# Patient Record
Sex: Male | Born: 1970 | Race: White | Hispanic: No | Marital: Married | Smoking: Current every day smoker
Health system: Southern US, Community
[De-identification: ages and names within clinical notes are randomized; demographics above are authoritative.]

## PROBLEM LIST (undated history)

## (undated) DIAGNOSIS — G589 Mononeuropathy, unspecified: Secondary | ICD-10-CM

## (undated) DIAGNOSIS — K602 Anal fissure, unspecified: Secondary | ICD-10-CM

## (undated) DIAGNOSIS — M1990 Unspecified osteoarthritis, unspecified site: Secondary | ICD-10-CM

## (undated) DIAGNOSIS — K589 Irritable bowel syndrome without diarrhea: Secondary | ICD-10-CM

## (undated) DIAGNOSIS — I10 Essential (primary) hypertension: Secondary | ICD-10-CM

## (undated) DIAGNOSIS — G4730 Sleep apnea, unspecified: Secondary | ICD-10-CM

## (undated) DIAGNOSIS — G709 Myoneural disorder, unspecified: Secondary | ICD-10-CM

## (undated) DIAGNOSIS — K219 Gastro-esophageal reflux disease without esophagitis: Secondary | ICD-10-CM

## (undated) DIAGNOSIS — E7800 Pure hypercholesterolemia, unspecified: Secondary | ICD-10-CM

## (undated) HISTORY — DX: Mononeuropathy, unspecified: G58.9

## (undated) HISTORY — PX: URETHRAL CYST REMOVAL: SHX5128

## (undated) HISTORY — DX: Sleep apnea, unspecified: G47.30

## (undated) HISTORY — DX: Gastro-esophageal reflux disease without esophagitis: K21.9

## (undated) HISTORY — DX: Anal fissure, unspecified: K60.2

## (undated) HISTORY — DX: Myoneural disorder, unspecified: G70.9

## (undated) HISTORY — DX: Irritable bowel syndrome, unspecified: K58.9

## (undated) HISTORY — PX: TONSILLECTOMY: SUR1361

## (undated) HISTORY — PX: KNEE SURGERY: SHX244

## (undated) HISTORY — PX: SHOULDER SURGERY: SHX246

## (undated) HISTORY — PX: COLONOSCOPY: SHX174

## (undated) HISTORY — PX: FRACTURE SURGERY: SHX138

## (undated) HISTORY — DX: Unspecified osteoarthritis, unspecified site: M19.90

## (undated) HISTORY — PX: ELBOW SURGERY: SHX618

---

## 1999-12-23 ENCOUNTER — Emergency Department (HOSPITAL_COMMUNITY): Admission: EM | Admit: 1999-12-23 | Discharge: 1999-12-23 | Payer: Self-pay

## 2002-03-14 ENCOUNTER — Encounter: Payer: Self-pay | Admitting: Psychologist

## 2002-03-14 ENCOUNTER — Observation Stay (HOSPITAL_COMMUNITY): Admission: EM | Admit: 2002-03-14 | Discharge: 2002-03-15 | Payer: Self-pay | Admitting: Emergency Medicine

## 2002-07-23 ENCOUNTER — Encounter: Payer: Self-pay | Admitting: Orthopedic Surgery

## 2002-07-23 ENCOUNTER — Inpatient Hospital Stay (HOSPITAL_COMMUNITY): Admission: EM | Admit: 2002-07-23 | Discharge: 2002-07-26 | Payer: Self-pay | Admitting: Emergency Medicine

## 2003-08-15 ENCOUNTER — Encounter: Admission: RE | Admit: 2003-08-15 | Discharge: 2003-08-15 | Payer: Self-pay | Admitting: Family Medicine

## 2004-09-18 ENCOUNTER — Emergency Department (HOSPITAL_COMMUNITY): Admission: EM | Admit: 2004-09-18 | Discharge: 2004-09-18 | Payer: Self-pay | Admitting: Emergency Medicine

## 2008-11-21 ENCOUNTER — Ambulatory Visit (HOSPITAL_COMMUNITY): Admission: RE | Admit: 2008-11-21 | Discharge: 2008-11-21 | Payer: Self-pay | Admitting: Surgery

## 2008-11-22 ENCOUNTER — Ambulatory Visit (HOSPITAL_BASED_OUTPATIENT_CLINIC_OR_DEPARTMENT_OTHER): Admission: RE | Admit: 2008-11-22 | Discharge: 2008-11-23 | Payer: Self-pay | Admitting: Urology

## 2010-03-06 ENCOUNTER — Emergency Department (HOSPITAL_COMMUNITY): Admission: EM | Admit: 2010-03-06 | Discharge: 2010-03-06 | Payer: Self-pay | Admitting: Emergency Medicine

## 2010-09-16 LAB — CBC
Platelets: 276 10*3/uL (ref 150–400)
RBC: 4.87 MIL/uL (ref 4.22–5.81)
WBC: 16.8 10*3/uL — ABNORMAL HIGH (ref 4.0–10.5)

## 2010-09-16 LAB — DIFFERENTIAL
Eosinophils Absolute: 0 10*3/uL (ref 0.0–0.7)
Lymphocytes Relative: 11 % — ABNORMAL LOW (ref 12–46)
Lymphs Abs: 1.8 10*3/uL (ref 0.7–4.0)
Monocytes Relative: 5 % (ref 3–12)
Neutro Abs: 14 10*3/uL — ABNORMAL HIGH (ref 1.7–7.7)
Neutrophils Relative %: 83 % — ABNORMAL HIGH (ref 43–77)

## 2010-09-16 LAB — WOUND CULTURE

## 2010-09-16 LAB — ANAEROBIC CULTURE

## 2010-09-16 LAB — POCT HEMOGLOBIN-HEMACUE: Hemoglobin: 15.7 g/dL (ref 13.0–17.0)

## 2010-10-22 NOTE — Op Note (Signed)
Aaron Poole, COUTANT               ACCOUNT NO.:  [id number]   MEDICAL RECORD NO.:  [id number]          PATIENT TYPE:  AMB   LOCATION:  [facility]                         FACILITY:  [facility]   PHYSICIAN:  Martina Sinner, MD DATE OF BIRTH:  16-Jun-1970   DATE OF PROCEDURE:  DATE OF DISCHARGE:                               OPERATIVE REPORT   PREOPERATIVE DIAGNOSIS:  Scrotal abscess.   POSTOPERATIVE DIAGNOSES:  1. Scrotal abscess.  2. Complicated urethral stricture.   SURGERY:  1. Drainage of scrotal abscess.  2. Cystoscopy.  3. Retrograde urethrogram.  4. Cystogram.  5. Insertion of Foley catheter.   [name] had minimal to no voiding dysfunction.  He came in  today with a scrotal abscess just to left lower lateral aspect of the  scrotum, almost more in the perineum.  He had a positive ultrasound  yesterday.   The patient was prepped and draped in the usual fashion.  He was given  preoperative antibiotics.  The patient was placed in lithotomy position.  Extra care was taken in positioning his heavy legs to minimize the risk  of injury.   I felt the 4 or 5 cm x 3-cm firm area in the area described.  I used a  marking pen.  I opened the abscess and there was foul-smelling pus.  It  was approximately half the size of my thumb.  I then finger-dissected  more deeply and laterally.  Even though I broke down a little bit of the  more soft tissue, there was no more abscess and just some mild  induration around the pocket.  I used saline and the Pulsavac for  approximately 900 mL.  I used cautery for some mild bleeding that was  superficial.   Before I packed the wound I thought it was best to cystoscope the  patient, recognizing that he could have an occult stricture with a  perforation, recognizing he really did not have any urinary symptoms  prior to this.   I initially used a flexible cystoscope and when I first looked inside, I  could see a penile urethra but a hazy, murky  bulbar urethra.  I was  concerned of the color of the urine and the fact that he might have  stricture.  I did not advance very far and then asked for a rigid  cystoscope.   I used a rigid cystoscope with the bag at appropriate height.  His  penile urethra was normal.  Again, he had a pale bulbar urethra and the  urine just looked cloudy.  I was concerned again that he had a  stricture.  I thought it was best to get a urethrogram at that point and  not try to scope or dilate a stricture that may be asymptomatic and not  contributing.  I basically wanted to make certain there was no  extravasation.   I would call for fluoroscopy.  I used a 20-cc syringe.  I did not  oblique him but with the penis on stretch, I did several fluoroscopic  and hard-copy views of his urethra.  He had a normal urethra with no  extravasation but he had an acute cut-off, in my opinion, 2 cm distal to  the membranous urethra.  He had minimal dye reach what appeared to be a  small bladder or empty bladder.  I brought fluoroscopy toward the  urethral meatus and, again, the urethra actually looked normal and  normal-caliber, but there was a nipple-like ending approximately 2 cm  distal to the membranous urethra but, again, no extravasation.   At this point, I thought it was best to use a 6-French open-end ureteral  catheter and place it into the proximal penile urethra and repeat the x-  rays, but the findings were basically the same.   I then used a 17-French cystoscope with the bottle up a little bit  higher.  It gave me a good view, and at this point his urethra was  bleeding minimally and he did have some mild panurethral stricture  disease.  I must say that his entire urethral caliber may be a little  bit smaller than most men, but the 21-French did eventually go in very  easily without dilation.  There was no question that he had what  appeared to be near-complete obliteration in the proximal bulbar   urethra.  Under fluoroscopy, I could see my scope stop where the x-ray  also stopped.   With the penis on stretch and with excellent help by the nurses, I tried  many times to pass a sensor wire through what I thought was a small  opening or dimple, but it was unsuccessful.   At this point in time I was concerned that he would need an open  suprapubic tube.  He is a very large man and his bladder did not appear  very full, and he could not safely have a suprapubic punch.   At this point in time, I asked Dr. Bjorn Pippin to come down.  Dr. Annabell Howells  actually used a rigid ureteroscope and duplicated the exact same  findings as I have just described.  We thought that we probably had to  open up the patient and put in the suprapubic tube, but Dr. Annabell Howells then  looked a little bit more dorsally and saw 1 little flap or opening.  A  sensor wire was easily placed through it up into the bladder.   Over the wire he placed a small dilator that is used for percutaneous  tubes.  It went in very nicely and was 14-French.  Fluoroscopically it  was in the bladder along with the curled sensor wire.  It was removed  and at this point in time we easily placed a 16-French council-tip  catheter over the wire.  I then did a cystogram before removing the  wire.  It was a low-volume cystogram but one could see the bladder with  no perforation and the Foley balloon sitting at the neck of the bladder.  The urine was clear and draining well.   The scrotal incision was packed with iodoform gauze, followed by a dry  dressing and scrotal support.   The patient was given perioperative antibiotics.   In my opinion, [name] appears to have a complex urethral stricture  disease.  I could see some spongy tissue distal to the obliteration, and  possibly this could be due to the retrograde with a closed-box  phenomenon, but really I was very careful and not using high volume  throughout the case.  I will leave the  catheter in for 10 days or  longer.  I will then do a peri-catheter urethrogram and not remove the  catheter until he has no extravasation.  Long-term management will  depend upon residual symptoms.  He may even need a cystoscopy under  anesthesia in the future.  A retrograde urethrogram following the peri-  catheter urethrogram will also be prudent   I spoke to the family, and the patient will be admitted overnight for  observation.          ______________________________  Martina Sinner, MD  Electronically Signed    SAM/MEDQ  D:  11/22/2008  T:  11/23/2008  Job:  [id number]

## 2010-10-25 NOTE — Op Note (Signed)
NAME:  Aaron Poole, Aaron Poole NO.:  [id number]   MEDICAL RECORD NO.:  [id number]                   PATIENT TYPE:  INP   LOCATION:  1828                                 FACILITY:  [facility]   PHYSICIAN:  Ollen Gross, M.D.                 DATE OF BIRTH:  01-Dec-1970   DATE OF PROCEDURE:  07/23/2002  DATE OF DISCHARGE:                                 OPERATIVE REPORT   PREOPERATIVE DIAGNOSIS:  Left tibial plateau fracture.   POSTOPERATIVE DIAGNOSES:  Left tibial plateau fracture.   PROCEDURE:  Open reduction internal fixation, left tibial plateau fracture  with bone grafting.   SURGEON:  Ollen Gross, M.D.   ASSISTANT:  Alexzandrew L. Julien Girt, P.A.   ANESTHESIA:  General.   ESTIMATED BLOOD LOSS:  Minimal.   DRAINS:  None.   COMPLICATIONS:  None.   TOURNIQUET TIME:  29 min at 350 mmHg.   CONDITION:  Stable to recovery.   BRIEF CLINICAL NOTE:  The patient is a 40 year old male who was playing  basketball earlier this evening and landed awkwardly, hyperextending his  left knee and having immediate pain and deformity.  He said he heard a pop  at the time of landing.  He subsequently was rushed to the [facility]  Emergency room and noted to have a lateral tibial plateau fracture; was  subsequently transferred here as he is a patient in our practice.  He had a  displaced, depressed lateral plateau fracture, but the condyle itself was  not displaced.  He presents now for open reduction, internal fixation and  bone grafting.   PROCEDURE IN DETAIL:  After the successful administration of general  anesthetic, a tourniquet was placed high on the left thigh and the left  lower extremity prepped and draped in the usual sterile fashion.  The  extremity was wrapped in Esmarch, knee flexed and tourniquet inflated to 300  mmHg.  Under fluoroscopic guidance I applied traction, and it really reduced  nicely.  The joint line was maintained, with the exception  of the depressed  area.  I then made about a two inch incision just lateral to the tibial  tubercle, and cut the skin with a #10 blade along the tibial crest.  We then  subperiosteally elevated the soft tissue over the proximal and lateral tibia  within this length of the incision.  The fracture identified and then with a  Cobb elevator I was able to elevate the central depressed fragment, back up  to where it was anatomic with the rest of the joint.  We then packed the  defect with 10 cc of Allomatrix graft with the cancellous chips.  This  effectively elevated the joint line back to its anatomic postion, and filled  in the defect in the metaphysis.  I then placed two guide pins for 6.5 Ace  cannulated screws, from lateral to medial across the  condyles parallel to  the joint line.  The lengths were measured and it was 80 and 85 mm.  Small  incisions were made, and the screws were placed with washers.  This  effectively led to essentially anatomic reduction of the fracture with less  than 1 mm of incongruency.  He was extremely pleased with the reduction, and  all the defect that was present on the preoperative xrays was essentially  obliterated with the bone graft.  The wounds were then copiously irrigated  with saline, and then the tourniquet released a total time of 29 min.  The  incisions with the cannulated screws were closed with  4-0 nylon.  The periosteum was closed with interrupted #1 Vicryl  subcutaneously, interrupted 2-0 Vicryl in skin and interrupted 4-0 nylon.  A  bulky sterile dressing was applied.  He was placed back into a knee  immobilizer, awakened and transferred to recovery room in stable condition.                                               Ollen Gross, M.D.    FA/MEDQ  D:  07/23/2002  T:  07/23/2002  Job:  [id number]

## 2010-10-25 NOTE — H&P (Signed)
NAME:  Aaron Poole, Aaron Poole NO.:  [id number]   MEDICAL RECORD NO.:  [id number]                   PATIENT TYPE:  INP   LOCATION:  5009                                 FACILITY:  [facility]   PHYSICIAN:  Ollen Gross, M.D.                 DATE OF BIRTH:  December 06, 1970   DATE OF ADMISSION:  07/23/2002  DATE OF DISCHARGE:                                HISTORY & PHYSICAL   CHIEF COMPLAINT:  Left leg pain.   HISTORY OF PRESENT ILLNESS:  This is a 40 year old male who was brought into  [facility] Emergency Department for an injury sustained to his left leg when  he was playing basketball earlier today. The patient states he was going up  for a shot and came down on an uneven surface of concrete, twisting and  landing onto his left lower leg. He had immediate onset of pain, unable to  ambulate. He was transferred eventually to [facility] for a plateau  fracture. Dr. Ollen Gross was on call for orthopedics at [facility]. The patient was seen and subsequently admitted to the hospital for  surgery.   ALLERGIES:  No known drug allergies.   INTOLERANCES:  Aspirin causes nose bleeds.   CURRENT MEDICATIONS:  Over-the-counter Zantac p.r.n.   PAST MEDICAL HISTORY:  Acid reflux, history of gastrointestinal ulcer. Past  history of fractures, bilateral clavicle fracture, right arm fracture, right  leg fracture, left arm fracture.   PAST SURGICAL HISTORY:  Right _____ arthroscopy x2, and also tonsillectomy.   SOCIAL HISTORY:  He is single, but currently engaged. A one to two pack per  day smoker. No alcohol. He works on an Engineer, manufacturing systems.   FAMILY HISTORY:  Significant for hypertension and cancer. His mother is  living age 48 with diabetes. His father living age 76 bipolar disease.   REVIEW OF SYMPTOMS:  General:  No fever, chills, or night sweats.  Neurologic:  He did have a presyncopal, light-headed type episode following  the fall due to  his pain. No seizure, syncope, or paralysis. Respiratory:  No shortness of breath. No hemoptysis. Cardiovascular: No chest pain, or  orthopnea. GI:  No nausea, vomiting, diarrhea, or constipation.  GU:  No  dysuria, hematuria, or discharge. Musculoskeletal:  Pertinent left leg as in  the history of present illness.   PHYSICAL EXAMINATION:  VITAL SIGNS:  Temperature 98.7, pulse 73,  respirations 16, blood pressure 167/92.  GENERAL:  The patient is a 40 year old white male well-nourished, well-  developed. No acute distress. He is alert, oriented, and cooperative.  Accompanied by his family.  HEENT:  Normocephalic, atraumatic. Pupils round and reactive. EOMs intact.  Oropharynx clear.  NECK:  Supple. No carotid bruit.  CHEST:  Clear to auscultation. Anterior breath sounds chest wall. No  rhonchi, no rales, no wheezing.  HEART:  Regular rate and rhythm. No murmur.  S1, S2 noted.  ABDOMEN:  Soft, nontender. Bowel sounds present. Round.  RECTAL:  Not done. Not pertinent to gross illness.  EXTREMITIES:  Significant of the left lower extremity. The left leg is noted  to be in knee immobilizer for stability. He has a large intra-articular  effusion on the left knee. Motor and flexion intact. Moved foot and toe on  exam. He has +2 dorsalis, pedis, and posterior tibial pulses noted on exam.  Sensation is intact.   LABORATORY DATA:  X-rays show a lateral tibial plateau intra-articular  component.   IMPRESSION:  1. Left lateral tibial plateau fracture.  2. Acid reflux.  3. History of gastrointestinal ulcer.   PLAN:  He is being admitted to [facility] to undergo ORIF of the  left tibial plateau fracture. Surgery is to be performed by Dr. Ollen Gross.     Alexzandrew L. Julien Girt, P.A.              Ollen Gross, M.D.    ALP/MEDQ  D:  07/26/2002  T:  07/26/2002  Job:  [id number]

## 2010-10-25 NOTE — Discharge Summary (Signed)
NAME:  Aaron Poole, Aaron Poole NO.:  [id number]   MEDICAL RECORD NO.:  [id number]                   PATIENT TYPE:  INP   LOCATION:  5009                                 FACILITY:  [facility]   PHYSICIAN:  Ollen Gross, M.D.                 DATE OF BIRTH:  12/21/1970   DATE OF ADMISSION:  07/23/2002  DATE OF DISCHARGE:  07/26/2002                                 DISCHARGE SUMMARY   ADMISSION DIAGNOSES:  1. Left lateral tibial plateau fracture.  2. Acid reflux.  3. History of gastrointestinal ulcer.   DISCHARGE DIAGNOSES:  1. Left lateral tibial plateau fracture, status post open reduction and     internal fixation left lateral tibial plateau.  2. Acid reflux.  3. History of gastrointestinal ulcer.   PROCEDURE:  The patient was taken to the operating room on July 23, 2002, and underwent open reduction and internal fixation of a left tibial  plateau fracture with bone grafting.  Surgeon was Ollen Gross, M.D.  Assistant was Alexzandrew L. Julien Girt, P.A. Anesthesia was general.  Minimal  blood loss.  Tourniquet time of 29 minutes at 350 mmHg.   CONSULTATIONS:  None.   HISTORY OF PRESENT ILLNESS:  The patient is a 40 year old male who was  brought to the [facility] emergency department after sustaining a  left leg injury when he was playing basketball earlier today. He landed  awkwardly, hyperextending his left knee, had immediate pain and deformity.  He was rushed to [facility] emergency room and noted to have a lateral  tibial plateau fracture and was subsequently transferred to [facility] as he was a patient in our practice. He was found to have the  displaced plateau fracture and it was felt he would best be served by  admission and surgery.   LABORATORY DATA:  CBC on admission; hemoglobin 14.9, hematocrit 42.7, white  blood cell count 14.9, red cell count 4.95.  Chemistry panel within normal  limits with the exception of  minimally elevated glucose of 128 and slightly  elevated ALT of 60.  PT/INR were followed throughout the hospital course and  defined in the laboratory data section of this chart.  Serial CBCs were  followed.  Hemoglobin dropped to 13.0 and 37.4.  Hematocrit was last noted  at 14.3 and 41.8.  Urinalysis did show positive ketones, otherwise negative  UA.  Urine culture; no growth.   HOSPITAL COURSE:  The patient was admitted to [facility] and taken  to the operating room and underwent the above stated procedure late that  evening.  Tolerated the procedure well.  Was later transferred to the  recovery room and then to the orthopedic floor for continued postoperative  care and the patient was placed on p.o. and IV analgesics for pain control  following surgery.  Started on Coumadin for DVT prophylaxis.  PT was ordered  postoperatively.  Strict nonweightbearing to the left lower extremity.  PT  and INR were followed daily for Coumadin protocol.  The patient was given 24  hours of postoperative IV antibiotics.  On day #1, the patient had a fair  amount of pain. Was starting to get up with physical therapy.  Encouraged in  ankle pumps.  Neurovascularly intact.  By day #2, the patient was doing a  little better and had better pain control.  Xanax was added for anxiety.  ___________ was initiated. Incision was healing well.  Sutures were intact.  He continued to progress with physical therapy and by postoperative day #3,  the patient was doing much better, was tolerated his medicines, had been up  ambulating and nonweightbearing with PT and it was decided he could be  discharged home. Home health was arranged through [place].   PLAN:  The patient was discharged home on July 26, 2002.   DISCHARGE MEDICATIONS:  1. Coumadin was performed as per protocol.  2. Percocet for pain.  3. Robaxin for spasm.  4. Xanax 0.25 mg p.r.n. anxiety.   DIET:  As tolerated.   FOLLOW UP:  The  patient will follow up in 10 to 14 days from surgery.  Call  the office for an appointment at [phone].   ACTIVITY:  Continue and encourage range of motion to the left knee.  He is  nonweightbearing to the left lower extremity.  Daily dressing changes at  home.  May start showering tomorrow.   DISPOSITION:  To home.   CONDITION ON DISCHARGE:  Improved.     Alexzandrew L. Julien Girt, P.A.              Ollen Gross, M.D.    ALP/MEDQ  D:  09/07/2002  T:  09/08/2002  Job:  [id number]

## 2010-10-25 NOTE — H&P (Signed)
NAME:  Aaron Poole, Aaron Poole NO.:  [id number]   MEDICAL RECORD NO.:  [id number]                   PATIENT TYPE:  EMS   LOCATION:  ED                                   [facility]ACILITY[facility]  [facility]   PHYSICIAN:  Angelia Mould. Derrell Lolling, M.D.             DATE OF BIRTH:  January 17, 1971   DATE OF ADMISSION:  03/14/2002  DATE OF DISCHARGE:                                HISTORY & PHYSICAL   CHIEF COMPLAINT:  Abdominal pain, vomiting, and diarrhea.   HISTORY OF PRESENT ILLNESS:  This is a 40 year old white man who states that  for two weeks he has had intermittent episodes of lower abdominal pain,  mostly suprapubic which occasionally radiates to the right side.  He states  that this pain is occurring daily, will last about one minute and then  completely resolve only to occur several hours later.  For the past three  days he has been vomiting once or twice a day and has had two to three  watery, nonbloody stools per day.  Denies fever or chills.  Denies prior  episodes.  Denies rigors.  He vomited in the middle of the night but this  morning he awoke and ate some chicken and has kept that down and does not  feel nauseated at this time.  He denies dysuria or urinary tract symptoms.  He is admitted for further evaluation and management.   PAST MEDICAL HISTORY:  He has had his knee arthroscoped twice.  He has had  tonsillectomy.  He has no other medical or surgical problems.   CURRENT MEDICATIONS:  None.   ALLERGIES:  ASPIRIN causes nose bleeds.   SOCIAL HISTORY:  The patient lives in [place].  He is separated.  He  lives with his child.  His parents live nearby.  He works for the [employer] for [employer].  He smokes one and a half packs of cigarettes  per day.  Denies the use of alcohol.  Denies the use of other drugs.   FAMILY HISTORY:  Mother living.  Has diabetes and hypertension.  Father  living.  Has had an appendectomy and has bipolar disorder.   REVIEW  OF SYMPTOMS:  All systems are reviewed and are noncontributory except  as described above.   PHYSICAL EXAMINATION:  GENERAL:  Pleasant, significantly overweight white  man in no acute distress.  He seems somewhat tired and fatigued, but does  not appear to be in much pain.  VITAL SIGNS:  Stated weight 256 pounds, temperature 98.8, pulse 71,  respirations 18, blood pressure 144/77.  HEENT:  Sclerae are clear.  Extraocular movements are intact.  Oropharynx  clear.  NECK:  Supple, nontender.  No mass.  No bruit.  LUNGS:  Clear to auscultation.  Question of mild right CVA tenderness.  HEART:  Regular rate and rhythm.  No murmur.  ABDOMEN:  Obese, soft, hypoactive bowel sounds,  not distended.  No mass.  No  hernia.  He is tender in the suprapubic area and the right lower quadrant  but his abdomen is soft.  The tenderness is only on deep palpation and I do  not detect any peritoneal signs.  GENITALIA:  Normal male pattern.  No hernias.  EXTREMITIES:  No edema.  Good pulses.  NEUROLOGIC:  Grossly within normal limits.   IMPRESSION:  Lower abdominal pain, vomiting, diarrhea, etiology unclear.  This might be an atypical appendicitis, a viral syndrome, or atypical  diverticulitis.  The patient is dehydrated.   PLAN:  The patient will be admitted.  Laboratory work and a CT scan of the  abdomen will be obtained.  IV fluids will be started now to rehydrate him.  I will reevaluate him later today after the CAT scan is done.                                               Angelia Mould. Derrell Lolling, M.D.    HMI/MEDQ  D:  03/14/2002  T:  03/14/2002  Job:  [id number]   cc:   [place]. Andrey Campanile, M.D.  [address]  [place]  [place] [address]  Fax: [phone]

## 2012-04-19 ENCOUNTER — Encounter (HOSPITAL_COMMUNITY): Payer: Self-pay | Admitting: *Deleted

## 2012-04-19 ENCOUNTER — Emergency Department (HOSPITAL_COMMUNITY): Payer: 59

## 2012-04-19 ENCOUNTER — Emergency Department (HOSPITAL_COMMUNITY)
Admission: EM | Admit: 2012-04-19 | Discharge: 2012-04-19 | Disposition: A | Discharge status: Home / Self Care | Payer: 59 | Attending: Emergency Medicine | Admitting: Emergency Medicine

## 2012-04-19 DIAGNOSIS — S52123A Displaced fracture of head of unspecified radius, initial encounter for closed fracture: Secondary | ICD-10-CM

## 2012-04-19 DIAGNOSIS — W1789XA Other fall from one level to another, initial encounter: Secondary | ICD-10-CM | POA: Insufficient documentation

## 2012-04-19 DIAGNOSIS — Y9389 Activity, other specified: Secondary | ICD-10-CM | POA: Insufficient documentation

## 2012-04-19 DIAGNOSIS — E7800 Pure hypercholesterolemia, unspecified: Secondary | ICD-10-CM | POA: Insufficient documentation

## 2012-04-19 DIAGNOSIS — Y929 Unspecified place or not applicable: Secondary | ICD-10-CM | POA: Insufficient documentation

## 2012-04-19 DIAGNOSIS — F17200 Nicotine dependence, unspecified, uncomplicated: Secondary | ICD-10-CM | POA: Insufficient documentation

## 2012-04-19 HISTORY — DX: Pure hypercholesterolemia, unspecified: E78.00

## 2012-04-19 MED ORDER — OXYCODONE-ACETAMINOPHEN 5-325 MG PO TABS: 2.0000 | ORAL_TABLET | ORAL | Status: DC | PRN

## 2012-04-19 MED ORDER — OXYCODONE-ACETAMINOPHEN 5-325 MG PO TABS
1.0000 | ORAL_TABLET | Freq: Once | ORAL | Status: AC
  Administered 2012-04-19: 1 via ORAL
  Filled 2012-04-19: qty 1

## 2012-04-19 NOTE — ED Notes (Signed)
Ortho tech contacted   To get a sling

## 2012-04-19 NOTE — ED Provider Notes (Signed)
History   This chart was scribed for Aaron Horn, MD by Gerlean Ren, ED Scribe. This patient was seen in room TR05C/TR05C and the patient's care was started at 9:16 PM    CSN: [id number]  Arrival date & time 04/19/12  4098   First MD Initiated Contact with Patient 04/19/12 2115      Chief Complaint  Patient presents with  . Arm Pain    right    (Consider location/radiation/quality/duration/timing/severity/associated sxs/prior treatment) The history is provided by the patient. No language interpreter was used.   DARCEL FRANE is a 41 y.o. male who presents to the Emergency Department complaining of severe right upper extremity tenderness and pain at right elbow after falling and bracing weight with right wrist while getting into truck earlier this evening with no head trauma or LOC.  Pt has no h/o chronic medical conditions.  Pt is a current everyday smoker and reports alcohol use.No weak/numbness. No head injury, no LOC, AMS, amnesia, neck or back pain.   Past Medical History  Diagnosis Date  . High cholesterol     History reviewed. No pertinent past surgical history.  History reviewed. No pertinent family history.  History  Substance Use Topics  . Smoking status: Current Every Day Smoker  . Smokeless tobacco: Not on file  . Alcohol Use: Yes      Review of Systems 10 Systems reviewed and are negative for acute change except as noted in the HPI.  Allergies  Aspirin  Home Medications   Current Outpatient Rx  Name  Route  Sig  Dispense  Refill  . OXYCODONE-ACETAMINOPHEN 5-325 MG PO TABS   Oral   Take 2 tablets by mouth every 4 (four) hours as needed for pain.   30 tablet   0     BP 155/103  Pulse 91  Temp 98.5 F (36.9 C) (Oral)  Resp 18  SpO2 95%  Physical Exam  Nursing note and vitals reviewed. Constitutional:       Awake, alert, nontoxic appearance.  HENT:  Head: Atraumatic.  Eyes: Right eye exhibits no discharge. Left eye exhibits no  discharge.  Neck: Neck supple.  Pulmonary/Chest: Effort normal. He exhibits no tenderness.  Abdominal: Soft. There is no tenderness. There is no rebound.  Musculoskeletal:       Cervical spine non-tender. Right hand and wrist have normal ROM and no bony or joint tenderness. CR<2secs right hand. Intact light touch and motor to median, radial, and ulnar Right shoulder non-tender.  Tenderness over right radial head. Superficial abrasions over olecranon process.   Neurological:       Mental status and motor strength appears baseline for patient and situation.  Skin: No rash noted.  Psychiatric: He has a normal mood and affect.    ED Course  Procedures (including critical care time) DIAGNOSTIC STUDIES: Oxygen Saturation is 95% on room air, adequate by my interpretation.    COORDINATION OF CARE: 9:20 PM- Patient informed of clinical course, understands medical decision-making process, and agrees with plan.  Informed pt of positive fracture of right radius.        Labs Reviewed - No data to display Dg Elbow Complete Right  04/19/2012  *RADIOLOGY REPORT*  Clinical Data: Arm pain.  Fall  RIGHT ELBOW - COMPLETE 3+ VIEW  Comparison: None  Findings: There is a moderate sized joint effusion.  Nondisplaced fracture involves the radial head.  No radiopaque foreign bodies or soft tissue calcifications.  No dislocation.  IMPRESSION:  1.  Joint effusion. 2.  Radial head fracture.   Original Report Authenticated By: Signa Kell, M.D.      1. Radial head fracture, closed       MDM  I personally performed the services described in this documentation, which was scribed in my presence. The recorded information has been reviewed and is accurate. I doubt any other EMC precluding discharge at this time including, but not necessarily limited to the following:TBI, CSI.        Aaron Horn, MD 04/23/12 [id number]

## 2012-04-19 NOTE — ED Notes (Signed)
Pt states that his left ankle has a hx of rolling and it did tonight when he was getting in to his truck. Pt states that he fell and landed on his right arm. Pt wife states elbow is swollen. Pt has arm in t-shirt in triage and cannot move arm. Pt states that hit right elbow.

## 2012-04-19 NOTE — Progress Notes (Signed)
Orthopedic Tech Progress Note Patient Details:  Aaron Poole 13-Feb-1971 [id number]  Ortho Devices Type of Ortho Device: Arm foam sling Ortho Device/Splint Location: (R) UE Ortho Device/Splint Interventions: Application   Jennye Moccasin 04/19/2012, 9:35 PM

## 2012-04-19 NOTE — ED Notes (Signed)
The pt has a painful entire rt arm especially in his rt elbow and his forearm.  He lost his footing and fell approx 4 hours ago.  His arm became more painful on the drive home.  Pain with movement,  Limited motion.

## 2013-06-02 IMAGING — CR DG ELBOW COMPLETE 3+V*R*
4 series · 4 of 4 positions shown · non-contrast
Comparison: None

CLINICAL DATA: Arm pain.  Fall

RIGHT ELBOW - COMPLETE 3+ VIEW

[x elbow joint lat right]
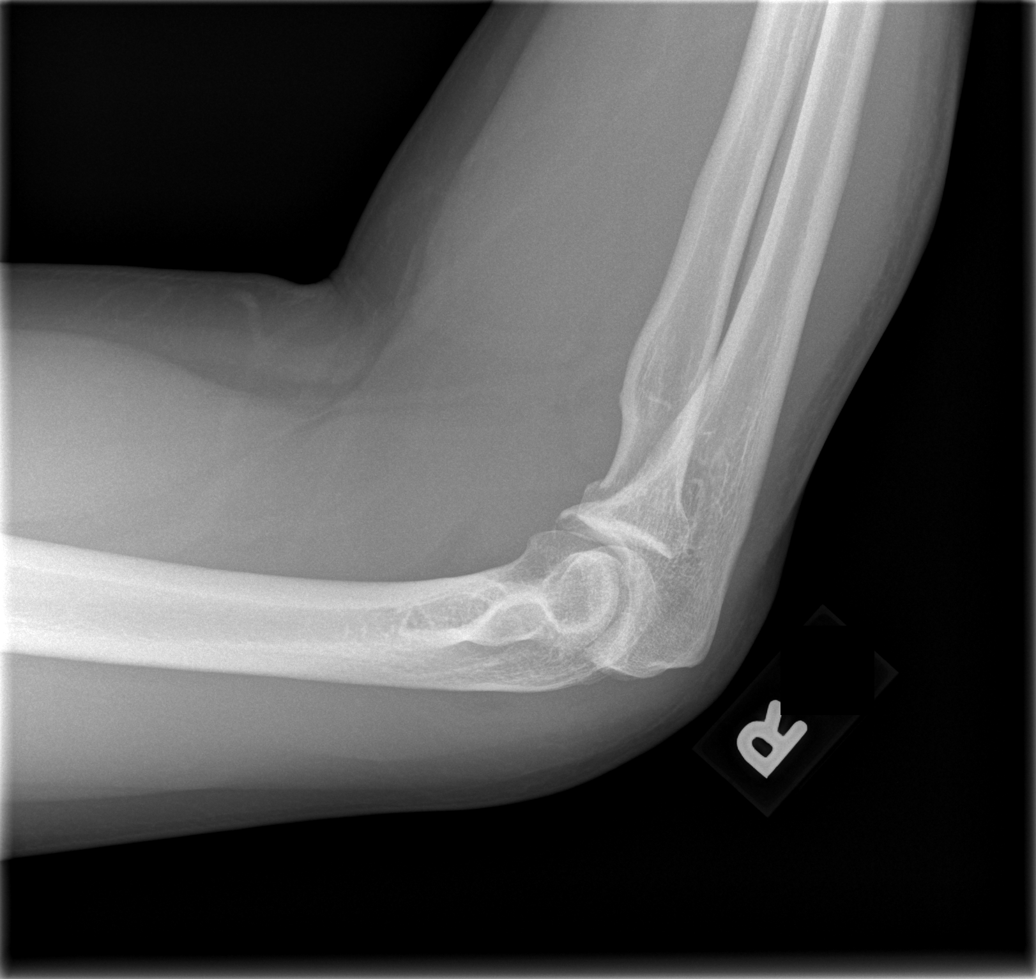

[x elbow joint ap right]
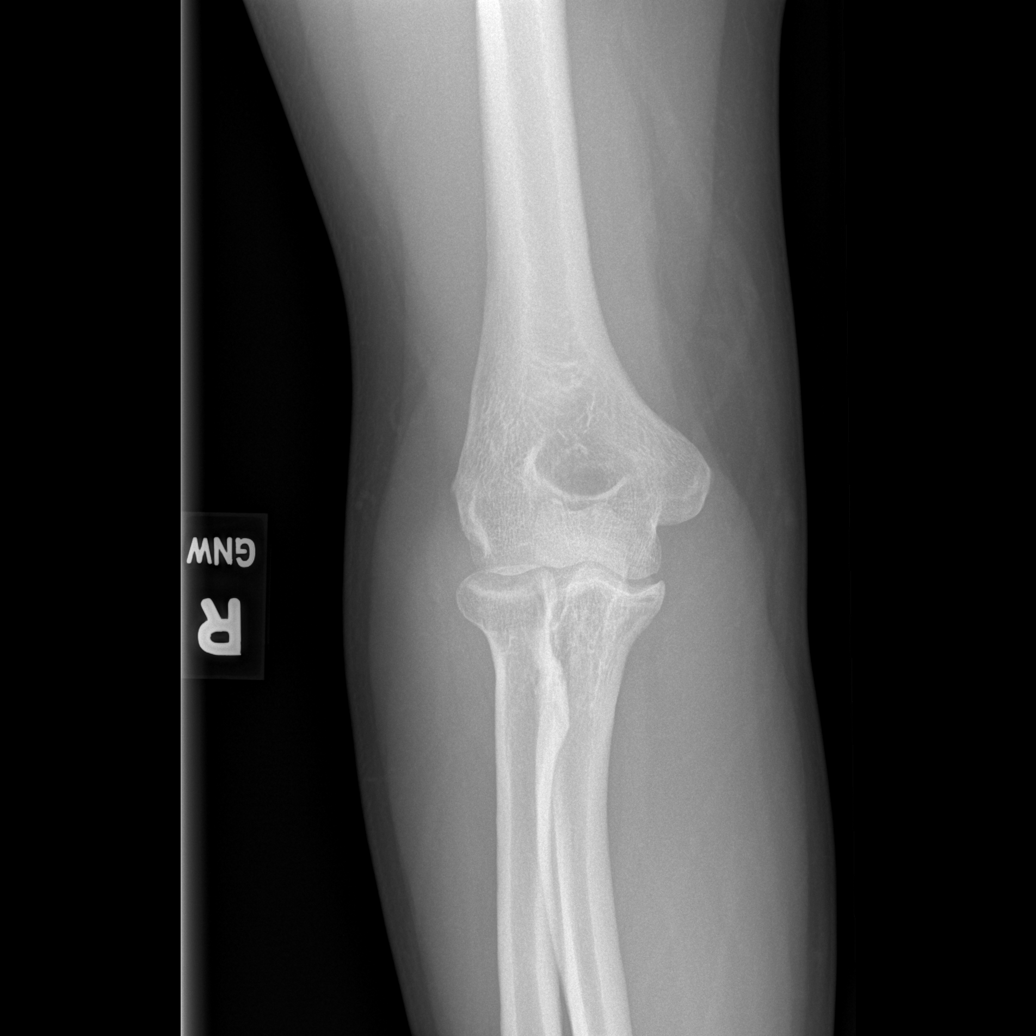

[x elbow joint obl. right]
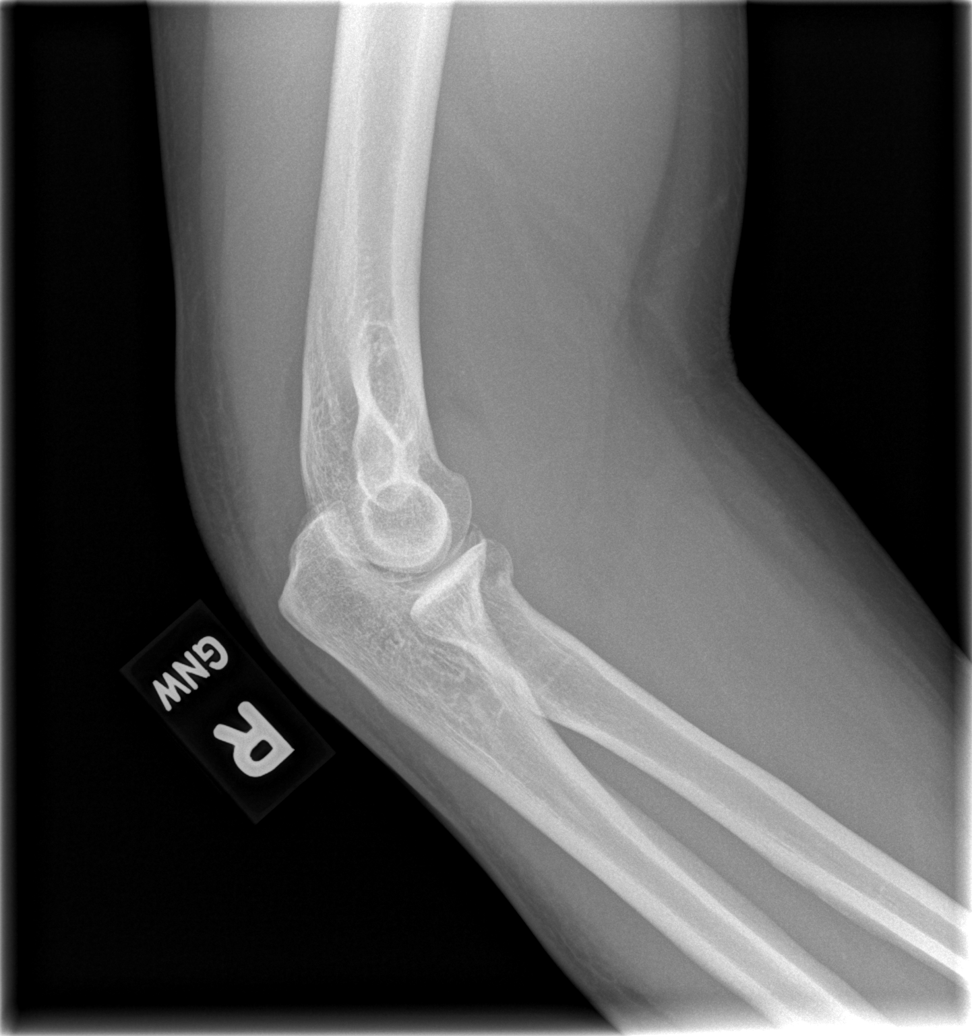

[x elbow joint obl. right *]
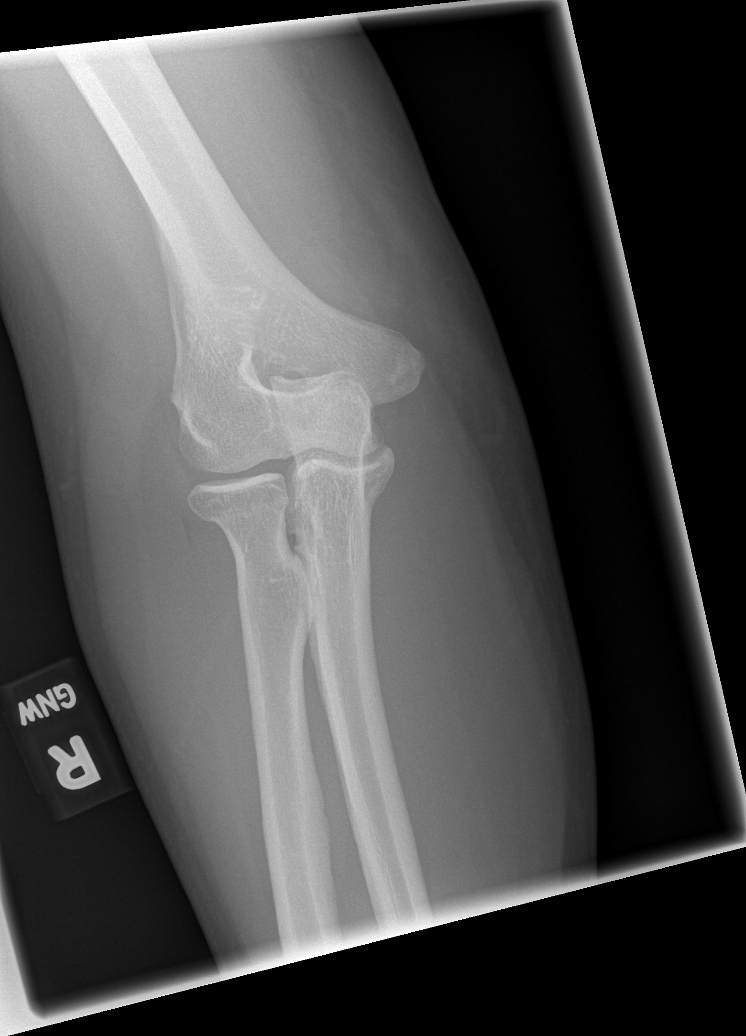

[4 of 4 positions shown; findings below may reference images not displayed]

FINDINGS: There is a moderate sized joint effusion.

Nondisplaced fracture involves the radial head.

No radiopaque foreign bodies or soft tissue calcifications.  No
dislocation.
IMPRESSION: 1.  Joint effusion.
2.  Radial head fracture.

## 2014-10-16 ENCOUNTER — Encounter (HOSPITAL_COMMUNITY): Payer: Self-pay | Admitting: Cardiology

## 2014-10-16 ENCOUNTER — Emergency Department (HOSPITAL_COMMUNITY): Payer: 59

## 2014-10-16 ENCOUNTER — Emergency Department (HOSPITAL_COMMUNITY)
Admission: EM | Admit: 2014-10-16 | Discharge: 2014-10-16 | Disposition: A | Discharge status: Home / Self Care | Payer: 59 | Attending: Emergency Medicine | Admitting: Emergency Medicine

## 2014-10-16 DIAGNOSIS — E780 Pure hypercholesterolemia: Secondary | ICD-10-CM | POA: Insufficient documentation

## 2014-10-16 DIAGNOSIS — K2970 Gastritis, unspecified, without bleeding: Secondary | ICD-10-CM | POA: Diagnosis not present

## 2014-10-16 DIAGNOSIS — R109 Unspecified abdominal pain: Secondary | ICD-10-CM | POA: Diagnosis present

## 2014-10-16 DIAGNOSIS — Z79899 Other long term (current) drug therapy: Secondary | ICD-10-CM | POA: Diagnosis not present

## 2014-10-16 DIAGNOSIS — Z720 Tobacco use: Secondary | ICD-10-CM | POA: Diagnosis not present

## 2014-10-16 DIAGNOSIS — K449 Diaphragmatic hernia without obstruction or gangrene: Secondary | ICD-10-CM | POA: Insufficient documentation

## 2014-10-16 LAB — URINALYSIS, ROUTINE W REFLEX MICROSCOPIC
BILIRUBIN URINE: NEGATIVE
Glucose, UA: 100 mg/dL — AB
Hgb urine dipstick: NEGATIVE
KETONES UR: NEGATIVE mg/dL
Leukocytes, UA: NEGATIVE
NITRITE: NEGATIVE
PH: 5 (ref 5.0–8.0)
PROTEIN: NEGATIVE mg/dL
Specific Gravity, Urine: 1.027 (ref 1.005–1.030)
Urobilinogen, UA: 1 mg/dL (ref 0.0–1.0)

## 2014-10-16 LAB — CBC WITH DIFFERENTIAL/PLATELET
Basophils Absolute: 0.1 10*3/uL (ref 0.0–0.1)
Basophils Relative: 1 % (ref 0–1)
EOS ABS: 0.5 10*3/uL (ref 0.0–0.7)
EOS PCT: 5 % (ref 0–5)
HCT: 47.3 % (ref 39.0–52.0)
Hemoglobin: 16 g/dL (ref 13.0–17.0)
LYMPHS ABS: 4.3 10*3/uL — AB (ref 0.7–4.0)
Lymphocytes Relative: 40 % (ref 12–46)
MCH: 29.8 pg (ref 26.0–34.0)
MCHC: 33.8 g/dL (ref 30.0–36.0)
MCV: 88.1 fL (ref 78.0–100.0)
MONO ABS: 1 10*3/uL (ref 0.1–1.0)
Monocytes Relative: 10 % (ref 3–12)
NEUTROS ABS: 4.7 10*3/uL (ref 1.7–7.7)
NEUTROS PCT: 44 % (ref 43–77)
PLATELETS: 265 10*3/uL (ref 150–400)
RBC: 5.37 MIL/uL (ref 4.22–5.81)
RDW: 13.2 % (ref 11.5–15.5)
WBC: 10.6 10*3/uL — AB (ref 4.0–10.5)

## 2014-10-16 LAB — COMPREHENSIVE METABOLIC PANEL
ALK PHOS: 60 U/L (ref 38–126)
ALT: 44 U/L (ref 17–63)
AST: 28 U/L (ref 15–41)
Albumin: 3.9 g/dL (ref 3.5–5.0)
Anion gap: 7 (ref 5–15)
BUN: 9 mg/dL (ref 6–20)
CO2: 27 mmol/L (ref 22–32)
Calcium: 9.2 mg/dL (ref 8.9–10.3)
Chloride: 105 mmol/L (ref 101–111)
Creatinine, Ser: 0.91 mg/dL (ref 0.61–1.24)
GLUCOSE: 87 mg/dL (ref 70–99)
POTASSIUM: 4.6 mmol/L (ref 3.5–5.1)
SODIUM: 139 mmol/L (ref 135–145)
TOTAL PROTEIN: 6.7 g/dL (ref 6.5–8.1)
Total Bilirubin: 0.8 mg/dL (ref 0.3–1.2)

## 2014-10-16 LAB — LIPASE, BLOOD: LIPASE: 27 U/L (ref 22–51)

## 2014-10-16 MED ORDER — IOHEXOL 300 MG/ML  SOLN
100.0000 mL | Freq: Once | INTRAMUSCULAR | Status: AC | PRN
  Administered 2014-10-16: 100 mL via INTRAVENOUS

## 2014-10-16 MED ORDER — SUCRALFATE 1 G PO TABS: 1.0000 g | ORAL_TABLET | Freq: Three times a day (TID) | ORAL | Status: DC

## 2014-10-16 MED ORDER — ESOMEPRAZOLE MAGNESIUM 40 MG PO CPDR: 40.0000 mg | DELAYED_RELEASE_CAPSULE | Freq: Every day | ORAL | Status: DC

## 2014-10-16 MED ORDER — IOHEXOL 300 MG/ML  SOLN
25.0000 mL | Freq: Once | INTRAMUSCULAR | Status: AC | PRN
  Administered 2014-10-16: 25 mL via ORAL

## 2014-10-16 NOTE — ED Provider Notes (Signed)
CSN: [id number]     Arrival date & time 10/16/14  1118 History   First MD Initiated Contact with Patient 10/16/14 1508     Chief Complaint  Patient presents with  . Abdominal Pain  . Emesis     (Consider location/radiation/quality/duration/timing/severity/associated sxs/prior Treatment) HPI Patient presents to the emergency department with abdominal pain that started several days ago.  The patient states that he has had similar abdominal pains in the past, but got worse over the last several days.  Patient states that he has pretty significant history for GERD symptoms.  Patient states that he does not have any chest pain, shortness of breath, nausea, diarrhea, weakness, dizziness, blurred vision, headache, back pain, neck pain, cough, fever, rash, or syncope.  The patient states that he has had several episodes of vomiting.  States that he noted that it had this occur darker appearance the last few times he has vomited. Past Medical History  Diagnosis Date  . High cholesterol    History reviewed. No pertinent past surgical history. History reviewed. No pertinent family history. History  Substance Use Topics  . Smoking status: Current Every Day Smoker  . Smokeless tobacco: Not on file  . Alcohol Use: Yes    Review of Systems  All other systems negative except as documented in the HPI. All pertinent positives and negatives as reviewed in the HPI.  Allergies  Aspirin  Home Medications   Prior to Admission medications   Medication Sig Start Date End Date Taking? Authorizing Provider  acetaminophen (TYLENOL) 500 MG tablet Take 500 mg by mouth every 6 (six) hours as needed for mild pain.   Yes Historical Provider, MD  rosuvastatin (CRESTOR) 5 MG tablet Take 5 mg by mouth daily.   Yes Historical Provider, MD   BP 135/72 mmHg  Pulse 55  Temp(Src) 98.1 F (36.7 C) (Oral)  Resp 13  Ht 6' (1.829 m)  Wt 305 lb (138.347 kg)  BMI 41.36 kg/m2  SpO2 98% Physical Exam   Constitutional: He is oriented to person, place, and time. He appears well-developed and well-nourished. No distress.  HENT:  Head: Normocephalic and atraumatic.  Mouth/Throat: Oropharynx is clear and moist.  Eyes: Pupils are equal, round, and reactive to light.  Neck: Normal range of motion. Neck supple.  Cardiovascular: Normal rate, regular rhythm and normal heart sounds.  Exam reveals no gallop and no friction rub.   No murmur heard. Pulmonary/Chest: Effort normal and breath sounds normal. No respiratory distress.  Abdominal: Soft. Bowel sounds are normal. He exhibits no distension. There is tenderness. There is no rebound and no guarding.  Neurological: He is alert and oriented to person, place, and time. He exhibits normal muscle tone. Coordination normal.  Skin: Skin is warm and dry. No rash noted. No erythema.  Nursing note and vitals reviewed.   ED Course  Procedures (including critical care time) Labs Review Labs Reviewed  CBC WITH DIFFERENTIAL/PLATELET - Abnormal; Notable for the following:    WBC 10.6 (*)    Lymphs Abs 4.3 (*)    All other components within normal limits  URINALYSIS, ROUTINE W REFLEX MICROSCOPIC - Abnormal; Notable for the following:    Color, Urine AMBER (*)    Glucose, UA 100 (*)    All other components within normal limits  COMPREHENSIVE METABOLIC PANEL  LIPASE, BLOOD    Imaging Review Ct Abdomen Pelvis W Contrast  10/16/2014   CLINICAL DATA:  Abdominal pain.  Coffee ground emesis.  EXAM: CT ABDOMEN  AND PELVIS WITH CONTRAST  TECHNIQUE: Multidetector CT imaging of the abdomen and pelvis was performed using the standard protocol following bolus administration of intravenous contrast.  CONTRAST:  162mL OMNIPAQUE IOHEXOL 300 MG/ML  SOLN  COMPARISON:  Report of CT scan dated 03/14/2002  FINDINGS: Large hiatal hernia. Heart size is normal. Lung bases are clear. The liver, biliary tree, spleen, pancreas, adrenal glands, and kidneys are normal. The bowel  appears normal including the terminal ileum and appendix. Bladder and prostate gland are normal. No adenopathy, free air, or free fluid. No calcifications in the abdominal aorta. Minimal calcification in the left common iliac artery. Focal arthritic changes of both femoral heads.  IMPRESSION: No acute abnormality of the abdomen or pelvis.  Large hiatal hernia.   Electronically Signed   By: Lorriane Shire M.D.   On: 10/16/2014 17:24   Patient is referred to GI for further evaluation of the hiatal hernia felt.  This is causing a large portion of his symptoms.  Patient is made aware of the findings.  Told to return here as needed.  Also advised him to follow-up with his primary care doctor   Dalia Heading, PA-C 10/19/14 [place], MD 10/25/14 1434

## 2014-10-16 NOTE — ED Notes (Signed)
Pt reports that he has had a burning sensation under his left ribs after he would eat. Reports he vomited this morning and it was coffee ground emesis. Also reports a sharp pain when he swallows. Sent from [facility]

## 2014-10-16 NOTE — Discharge Instructions (Signed)
Return here as needed.  Follow-up with the GI Dr. Provided.

## 2014-10-16 NOTE — ED Notes (Signed)
Pt in CT.

## 2014-10-17 ENCOUNTER — Encounter: Payer: Self-pay | Admitting: Gastroenterology

## 2014-12-22 ENCOUNTER — Telehealth: Payer: Self-pay | Admitting: Gastroenterology

## 2014-12-22 ENCOUNTER — Ambulatory Visit: Payer: 59 | Admitting: Gastroenterology

## 2014-12-22 NOTE — Telephone Encounter (Signed)
See below

## 2014-12-22 NOTE — Telephone Encounter (Signed)
Penalty should be applied.

## 2014-12-22 NOTE — Telephone Encounter (Signed)
Dr. Deatra Ina,  Patient called to reschedule appointment for today.  Was due to be seen at 10:00 am.  Per patient, there is just no way that he can make it today.  I have rescheduled patient.  Advise for same day cancellation.

## 2015-02-27 ENCOUNTER — Encounter: Payer: Self-pay | Admitting: Gastroenterology

## 2015-02-27 ENCOUNTER — Ambulatory Visit (INDEPENDENT_AMBULATORY_CARE_PROVIDER_SITE_OTHER): Payer: 59 | Admitting: Gastroenterology

## 2015-02-27 VITALS — BP 132/80 | HR 80 | Ht 73.0 in | Wt 314.0 lb

## 2015-02-27 DIAGNOSIS — K625 Hemorrhage of anus and rectum: Secondary | ICD-10-CM | POA: Insufficient documentation

## 2015-02-27 DIAGNOSIS — R1314 Dysphagia, pharyngoesophageal phase: Secondary | ICD-10-CM

## 2015-02-27 DIAGNOSIS — Z800 Family history of malignant neoplasm of digestive organs: Secondary | ICD-10-CM | POA: Insufficient documentation

## 2015-02-27 DIAGNOSIS — R1012 Left upper quadrant pain: Secondary | ICD-10-CM | POA: Diagnosis not present

## 2015-02-27 MED ORDER — NA SULFATE-K SULFATE-MG SULF 17.5-3.13-1.6 GM/177ML PO SOLN: 1.0000 | Freq: Once | ORAL | Status: DC

## 2015-02-27 NOTE — Assessment & Plan Note (Signed)
Left upper quadrant pain is postprandial and could be due to ulcer or nonulcer dyspepsia.  Symptoms do not sound like an incarcerated hiatal hernia.  Recommendations #1 continue Prilosec #2 EGD  CC Dr. Barrie Folk

## 2015-02-27 NOTE — Addendum Note (Signed)
Addended by: Oda Kilts on: 02/27/2015 03:08 PM   Modules accepted: Orders

## 2015-02-27 NOTE — Patient Instructions (Signed)

## 2015-02-27 NOTE — Assessment & Plan Note (Signed)
Limited rectal bleeding is most likely hemorrhoidal

## 2015-02-27 NOTE — Assessment & Plan Note (Signed)
Rule out esophageal stricture.    Recommendations #1 EGD with dilation as indicated 

## 2015-02-27 NOTE — Progress Notes (Signed)
_                                                                                                                History of Present Illness:  Aaron Poole is a pleasant 44 year old white male referred at the request of Dr. Kirby Poole for evaluation of chest and upper abdominal discomfort.  He very frequently develops immediate postprandial left upper quadrant and left chest discomfort.  Pain is described as moderate and aching.  CT scan for coffee-ground emesis in May, 2016 demonstrated a large hiatal hernia.  He complains of dysphagia to solids.  He has occasional pyrosis while taking Prilosec.  He also complains of intermittent rectal bleeding consisting of blood mixed with his stools in the water.  He's had rectal discomfort in the past.  Family history is pertinent for internal grandmother and her 3 siblings who had colon cancer.  Mother had colon polyps.   Past Medical History  Diagnosis Date  . High cholesterol   . IBS (irritable bowel syndrome)   . GERD (gastroesophageal reflux disease)   . Sleep apnea   . Arthritis   . Anal fissure    Past Surgical History  Procedure Laterality Date  . Knee surgery Bilateral   . Elbow surgery Right   . Shoulder surgery Right   . Urethral cyst removal     family history includes COPD in his mother; Colon cancer in his maternal aunt; Colon polyps in his mother; Diabetes in his maternal aunt, maternal grandfather, and mother; Irritable bowel syndrome in his mother and sister. Current Outpatient Prescriptions  Medication Sig Dispense Refill  . acetaminophen (TYLENOL) 500 MG tablet Take 500 mg by mouth every 6 (six) hours as needed for mild pain.    Marland Kitchen esomeprazole (NEXIUM) 40 MG capsule Take 1 capsule (40 mg total) by mouth daily. 30 capsule 0  . rosuvastatin (CRESTOR) 5 MG tablet Take 5 mg by mouth daily.    . sucralfate (CARAFATE) 1 G tablet Take 1 tablet (1 g total) by mouth 4 (four) times daily -  with meals and at bedtime. 30 tablet 0     No current facility-administered medications for this visit.   Allergies as of 02/27/2015 - Review Complete 02/27/2015  Allergen Reaction Noted  . Aspirin  04/19/2012    reports that he has been smoking Cigarettes.  He has a 26 pack-year smoking history. He does not have any smokeless tobacco history on file. He reports that he drinks alcohol. He reports that he does not use illicit drugs.   Review of Systems: Pertinent positive and negative review of systems were noted in the above HPI section. All other review of systems were otherwise negative.  Vital signs were reviewed in today's medical record Physical Exam: General: Obese male in no acute distress Skin: anicteric Head: Normocephalic and atraumatic Eyes:  sclerae anicteric, EOMI Ears: Normal auditory acuity Mouth: No deformity or lesions Neck: Supple, no masses or thyromegaly Lymph Nodes: no lymphadenopathy Lungs: Clear throughout to auscultation Heart:  Regular rate and rhythm; no murmurs, rubs or bruits Gastroinestinal: Soft, non tender and non distended. No masses, hepatosplenomegaly or hernias noted. Normal Bowel sounds Rectal:deferred Musculoskeletal: Symmetrical with no gross deformities  Skin: No lesions on visible extremities Pulses:  Normal pulses noted Extremities: No clubbing, cyanosis, edema or deformities noted Neurological: Alert oriented x 4, grossly nonfocal Cervical Nodes:  No significant cervical adenopathy Inguinal Nodes: No significant inguinal adenopathy Psychological:  Alert and cooperative. Normal mood and affect  See Assessment and Plan under Problem List

## 2015-02-27 NOTE — Assessment & Plan Note (Signed)
There is a strong family history of colon cancer on the patient's maternal side.  Mother strongly is at higher risk for colon cancer which may thus far have been prevented by polypectomies.  The patient probably is at higher risk as well.  Recommendations #1 colonoscopy

## 2015-04-13 ENCOUNTER — Encounter: Payer: 59 | Admitting: Gastroenterology

## 2015-05-10 ENCOUNTER — Encounter: Payer: Self-pay | Admitting: Gastroenterology

## 2015-05-10 ENCOUNTER — Ambulatory Visit (AMBULATORY_SURGERY_CENTER): Payer: 59 | Admitting: Gastroenterology

## 2015-05-10 VITALS — BP 126/60 | HR 58 | Temp 97.1°F | Resp 18 | Ht 73.0 in | Wt 314.0 lb

## 2015-05-10 DIAGNOSIS — D124 Benign neoplasm of descending colon: Secondary | ICD-10-CM | POA: Diagnosis not present

## 2015-05-10 DIAGNOSIS — R1314 Dysphagia, pharyngoesophageal phase: Secondary | ICD-10-CM

## 2015-05-10 DIAGNOSIS — K222 Esophageal obstruction: Secondary | ICD-10-CM

## 2015-05-10 DIAGNOSIS — K625 Hemorrhage of anus and rectum: Secondary | ICD-10-CM

## 2015-05-10 DIAGNOSIS — D125 Benign neoplasm of sigmoid colon: Secondary | ICD-10-CM | POA: Diagnosis not present

## 2015-05-10 DIAGNOSIS — K639 Disease of intestine, unspecified: Secondary | ICD-10-CM

## 2015-05-10 DIAGNOSIS — D129 Benign neoplasm of anus and anal canal: Secondary | ICD-10-CM

## 2015-05-10 DIAGNOSIS — D123 Benign neoplasm of transverse colon: Secondary | ICD-10-CM

## 2015-05-10 DIAGNOSIS — K635 Polyp of colon: Secondary | ICD-10-CM | POA: Diagnosis not present

## 2015-05-10 DIAGNOSIS — K621 Rectal polyp: Secondary | ICD-10-CM

## 2015-05-10 DIAGNOSIS — Z800 Family history of malignant neoplasm of digestive organs: Secondary | ICD-10-CM

## 2015-05-10 DIAGNOSIS — R1012 Left upper quadrant pain: Secondary | ICD-10-CM

## 2015-05-10 DIAGNOSIS — D128 Benign neoplasm of rectum: Secondary | ICD-10-CM

## 2015-05-10 MED ORDER — SODIUM CHLORIDE 0.9 % IV SOLN
500.0000 mL | INTRAVENOUS | Status: DC
Start: 2015-05-10 — End: 2015-05-11

## 2015-05-10 NOTE — Patient Instructions (Addendum)
YOU HAD AN ENDOSCOPIC PROCEDURE TODAY AT [facility]:   Refer to the procedure report that was given to you for any specific questions about what was found during the examination.  If the procedure report does not answer your questions, please call your gastroenterologist to clarify.  If you requested that your care partner not be given the details of your procedure findings, then the procedure report has been included in a sealed envelope for you to review at your convenience later.  YOU SHOULD EXPECT: Some feelings of bloating in the abdomen. Passage of more gas than usual.  Walking can help get rid of the air that was put into your GI tract during the procedure and reduce the bloating. If you had a lower endoscopy (such as a colonoscopy or flexible sigmoidoscopy) you may notice spotting of blood in your stool or on the toilet paper. If you underwent a bowel prep for your procedure, you may not have a normal bowel movement for a few days.  Please Note:  You might notice some irritation and congestion in your nose or some drainage.  This is from the oxygen used during your procedure.  There is no need for concern and it should clear up in a day or so.  SYMPTOMS TO REPORT IMMEDIATELY:   Following lower endoscopy (colonoscopy or flexible sigmoidoscopy):  Excessive amounts of blood in the stool  Significant tenderness or worsening of abdominal pains  Swelling of the abdomen that is new, acute  Fever of 100F or higher   Following upper endoscopy (EGD)  Vomiting of blood or coffee ground material  New chest pain or pain under the shoulder blades  Painful or persistently difficult swallowing  New shortness of breath  Fever of 100F or higher  Black, tarry-looking stools  For urgent or emergent issues, a gastroenterologist can be reached at any hour by calling [phone].   DIET: See dilation diet-  Likewise, meals heavy in dairy and vegetables can increase bloating.  Drink  plenty of fluids but you should avoid alcoholic beverages for 24 hours.  ACTIVITY:  You should plan to take it easy for the rest of today and you should NOT DRIVE or use heavy machinery until tomorrow (because of the sedation medicines used during the test).    FOLLOW UP: Our staff will call the number listed on your records the next business day following your procedure to check on you and address any questions or concerns that you may have regarding the information given to you following your procedure. If we do not reach you, we will leave a message.  However, if you are feeling well and you are not experiencing any problems, there is no need to return our call.  We will assume that you have returned to your regular daily activities without incident.  If any biopsies were taken you will be contacted by phone or by letter within the next 1-3 weeks.  Please call us at [phone] if you have not heard about the biopsies in 3 weeks.    SIGNATURES/CONFIDENTIALITY: You and/or your care partner have signed paperwork which will be entered into your electronic medical record.  These signatures attest to the fact that that the information above on your After Visit Summary has been reviewed and is understood.  Full responsibility of the confidentiality of this discharge information lies with you and/or your care-partner.  Esophagitis, stricture,anti-reflux regimen, dilation diet, polyps-handouts given  Continue prilosec  Repeat colonoscopy will be  determined by pathology.

## 2015-05-10 NOTE — Op Note (Signed)
[place]  [address] [place], [address]   ENDOSCOPY PROCEDURE REPORT  PATIENT: Aaron Poole, Aaron Poole  MR#: [id number] BIRTHDATE: January 22, 1971 , 44  yrs. old GENDER: male ENDOSCOPIST: Harl Bowie, MD REFERRED BY:  Lennie Odor, P.A. PROCEDURE DATE:  05/10/2015 PROCEDURE:  EGD w/ balloon dilation ASA CLASS:     Class II INDICATIONS:  dysphagia. MEDICATIONS: Propofol 300 mg IV TOPICAL ANESTHETIC: none  DESCRIPTION OF PROCEDURE: After the risks benefits and alternatives of the procedure were thoroughly explained, informed consent was obtained.  The LB [id number] endoscope was introduced through the mouth and advanced to the second portion of the duodenum , Without limitations.  The instrument was slowly withdrawn as the mucosa was fully examined.    Esophageal mucosa appeared normal, distal esophageal stricture/ring noted, was able to traverse without difficulty.  LA grade B erosive esophagitis.  Balloon dilation performed with 18,19 &20 mm, no heme noted.  Gastric mucosa was normal and duodenum appeared normal. Retroflexed views revealed as previously described.     The scope was then withdrawn from the patient and the procedure completed.  COMPLICATIONS: There were no immediate complications.  ENDOSCOPIC IMPRESSION: Esophageal mucosa appeared normal, distal esophageal stricture/ring noted, was able to traverse without difficulty.  LA grade B erosive esophagitis.  Balloon dilation performed with 18,19 &20 mm, no heme noted.  Gastric mucosa was normal and duodenum appeared normal  RECOMMENDATIONS: 1.  Continue PPI 2.  Anti-reflux regimen to be follow 3.  Proceed with a Colonoscopy.    eSigned:  Harl Bowie, MD 05/10/2015 2:49 PM

## 2015-05-10 NOTE — Progress Notes (Signed)
Report to PACU, RN, vss, BBS= Clear.  

## 2015-05-10 NOTE — Op Note (Signed)
[place]  [address]. [place], [address]   COLONOSCOPY PROCEDURE REPORT  PATIENT: Aaron Poole, Aaron Poole  MR#: [id number] BIRTHDATE: 24-Mar-1971 , 44  yrs. old GENDER: male ENDOSCOPIST: Harl Bowie, MD REFERRED [id number] Red[place], P.A. PROCEDURE DATE:  05/10/2015 PROCEDURE:   Colonoscopy, diagnostic, Colonoscopy with cold biopsy polypectomy, and Colonoscopy with snare polypectomy First Screening Colonoscopy - Avg.  risk and is 50 yrs.  old or older Yes.  Prior Negative Screening - Now for repeat screening. N/A  History of Adenoma - Now for follow-up colonoscopy & has been > or = to 3 yrs.  N/A  Polyps removed today? Yes ASA CLASS:   Class II INDICATIONS:Evaluation of unexplained GI bleeding, FH Colon or Rectal Adenocarcinoma, and FH Colon Adenoma. MEDICATIONS: Propofol 200 mg IV  DESCRIPTION OF PROCEDURE:   After the risks benefits and alternatives of the procedure were thoroughly explained, informed consent was obtained.  The digital rectal exam revealed no abnormalities of the rectum.   The LB [id number]  endoscope was introduced through the anus and advanced to the cecum, which was identified by both the appendix and ileocecal valve. No adverse events experienced.   The quality of the prep was fair.  The instrument was then slowly withdrawn as the colon was fully examined. Estimated blood loss is zero unless otherwise noted in this procedure report.      COLON FINDINGS: Multiple sessile polyps ranging between 3-67mm in size were found in the sigmoid colon & rectum (9), transverse colon (2), and descending colon(1).  Polypectomies was performed with a cold snare.  The resection was complete, the polyp tissue was completely retrieved and sent to histology.  Abnormal fold with polypoid mucosa in transverse colon was biopsied. poor  bowel prep with inadequate visualization of the mucosa in ascending colon. A few hyperplastic appearing polyps in rectum  were not removed.     The time to cecum = 3.2 Withdrawal time = 21.0   The scope was withdrawn and the procedure completed. COMPLICATIONS: There were no immediate complications.  ENDOSCOPIC IMPRESSION: multiple sessile polyps benign appearing removed  RECOMMENDATIONS: 1.  If the polyp(s) removed today are proven to be adenomatous (pre-cancerous) polyps, you will need a colonoscopy in 3 years. Otherwise you should continue to follow colorectal cancer screening guidelines for "high risk" patients with a colonoscopy in 5 years. You will receive a letter within 1-2 weeks with the results of your biopsy as well as final recommendations.  Please call my office if you have not received a letter after 3 weeks. 2.  Await pathology results  eSigned:  Harl Bowie, MD 05/10/2015 2:55 PM

## 2015-05-10 NOTE — Progress Notes (Signed)
Called to room to assist during endoscopic procedure.  Patient ID and intended procedure confirmed with present staff. Received instructions for my participation in the procedure from the performing physician.  

## 2015-05-11 ENCOUNTER — Telehealth: Payer: Self-pay | Admitting: *Deleted

## 2015-05-11 NOTE — Telephone Encounter (Signed)
No answer, message left for the patient. 

## 2015-05-22 ENCOUNTER — Encounter: Payer: Self-pay | Admitting: Gastroenterology

## 2015-10-08 ENCOUNTER — Ambulatory Visit: Payer: 59

## 2015-10-08 ENCOUNTER — Other Ambulatory Visit: Payer: Self-pay | Admitting: Physician Assistant

## 2015-10-08 DIAGNOSIS — R0602 Shortness of breath: Secondary | ICD-10-CM

## 2015-10-08 DIAGNOSIS — R062 Wheezing: Secondary | ICD-10-CM

## 2016-05-20 ENCOUNTER — Encounter (HOSPITAL_COMMUNITY): Payer: Self-pay | Admitting: Emergency Medicine

## 2016-05-20 ENCOUNTER — Emergency Department (HOSPITAL_COMMUNITY)
Admission: EM | Admit: 2016-05-20 | Discharge: 2016-05-20 | Disposition: A | Discharge status: Home / Self Care | Payer: 59 | Attending: Emergency Medicine | Admitting: Emergency Medicine

## 2016-05-20 DIAGNOSIS — S90561A Insect bite (nonvenomous), right ankle, initial encounter: Secondary | ICD-10-CM | POA: Diagnosis not present

## 2016-05-20 DIAGNOSIS — T450X5A Adverse effect of antiallergic and antiemetic drugs, initial encounter: Secondary | ICD-10-CM | POA: Insufficient documentation

## 2016-05-20 DIAGNOSIS — W57XXXA Bitten or stung by nonvenomous insect and other nonvenomous arthropods, initial encounter: Secondary | ICD-10-CM | POA: Diagnosis not present

## 2016-05-20 DIAGNOSIS — Y939 Activity, unspecified: Secondary | ICD-10-CM | POA: Diagnosis not present

## 2016-05-20 DIAGNOSIS — T50905A Adverse effect of unspecified drugs, medicaments and biological substances, initial encounter: Secondary | ICD-10-CM

## 2016-05-20 DIAGNOSIS — R21 Rash and other nonspecific skin eruption: Secondary | ICD-10-CM | POA: Diagnosis not present

## 2016-05-20 DIAGNOSIS — S90562A Insect bite (nonvenomous), left ankle, initial encounter: Secondary | ICD-10-CM | POA: Diagnosis present

## 2016-05-20 DIAGNOSIS — F17210 Nicotine dependence, cigarettes, uncomplicated: Secondary | ICD-10-CM | POA: Diagnosis not present

## 2016-05-20 DIAGNOSIS — S30861A Insect bite (nonvenomous) of abdominal wall, initial encounter: Secondary | ICD-10-CM | POA: Diagnosis not present

## 2016-05-20 DIAGNOSIS — Y999 Unspecified external cause status: Secondary | ICD-10-CM | POA: Insufficient documentation

## 2016-05-20 DIAGNOSIS — Y929 Unspecified place or not applicable: Secondary | ICD-10-CM | POA: Insufficient documentation

## 2016-05-20 MED ORDER — PERMETHRIN 5 % EX CREA: TOPICAL_CREAM | CUTANEOUS | 0 refills | Status: DC

## 2016-05-20 NOTE — ED Notes (Addendum)
Pt ambulated in hall independently; c/o slight dizziness on the way back to the room; pt described as felling "drugged"

## 2016-05-20 NOTE — ED Triage Notes (Signed)
Brought via ems from home.  Reports having rash to arms, ankles, and waist.  Took benadryl last night at 2130 and felt ok.  Reported being difficult to wake up and had dizziness this morning.  [id number]

## 2016-05-20 NOTE — ED Provider Notes (Signed)
[facility] DEPT Provider Note   CSN: [id number] Arrival date & time: 05/20/16  [id number]     History   Chief Complaint Chief Complaint  Patient presents with  . Dizziness    HPI Aaron Poole is a 45 y.o. male.  HPI   45 year old male presents today with complaints of rash and grogginess. Patient notes that several weeks ago he started developing a rash to his upper extremities and lower extremities. He notes symptoms are worse while sitting on his couch before going to bed. Patient notes he wakes up with new lesions in the morning, does not develop any throughout the day. Patient denies any history of the same, reports several weeks ago pulled him and his wife stayed in a hotel in [place]. He also notes numerous pets in the house. Patient reports lesions resolve quickly, but new ones come up every day. Patient reports extremely itchy. He notes last night he took 2 Benadryl at around 9:30 and went to bed. His wife notes that this morning upon awakening he was difficult to arouse, and groggy. Patient reports that normally only takes one Benadryl, last night he took 2. Patient reports he is not dizzy, just groggy from the Benadryl. He denies any acute neurological deficits, history of stroke, head trauma.  Patient reports he's feeling less groggy since initial awakening. He denies any infectious etiology. Patient does note that he has intermittent tingling in his finger that has been going on for several months. 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   Past Medical History:  Diagnosis Date  . Anal fissure   . Arthritis   . GERD (gastroesophageal reflux disease)   . High cholesterol   . IBS (irritable bowel syndrome)   . Sleep apnea     Patient Active Problem List   Diagnosis Date Noted  . Abdominal pain, left upper  quadrant 02/27/2015  . Dysphagia, pharyngoesophageal phase 02/27/2015  . Family history of colon cancer 02/27/2015  . Rectal bleeding 02/27/2015    Past Surgical History:  Procedure Laterality Date  . ELBOW SURGERY Right   . KNEE SURGERY Bilateral   . SHOULDER SURGERY Right   . URETHRAL CYST REMOVAL        Home Medications    Prior to Admission medications   Medication Sig Start Date End Date Taking? Authorizing Provider  acetaminophen (TYLENOL) 500 MG tablet Take 500 mg by mouth every 6 (six) hours as needed for mild pain.   Yes Historical Provider, MD  omeprazole (PRILOSEC) 40 MG capsule Take 40 mg by mouth daily.   Yes Historical Provider, MD  rosuvastatin (CRESTOR) 5 MG tablet Take 5 mg by mouth daily.   Yes Historical Provider, MD  permethrin (ELIMITE) 5 % cream Apply to affected area once 05/20/16   Okey Regal, PA-C    Family History Family History  Problem Relation Age of Onset  . Colon cancer Maternal Aunt     2 great aunts  . Colon polyps Mother   . Diabetes Mother   . COPD Mother   . Irritable bowel syndrome Mother   . Diabetes Maternal Grandfather   . Irritable bowel syndrome Sister   . Diabetes Maternal Aunt     Social History Social History  Substance Use Topics  . Smoking status: Current Every Day Smoker    Packs/day:  1.00    Years: 26.00    Types: Cigarettes  . Smokeless tobacco: Never Used  . Alcohol use 0.0 oz/week     Comment: occasionally     Allergies   Aspirin   Review of Systems Review of Systems  All other systems reviewed and are negative.    Physical Exam Updated Vital Signs BP 136/92   Pulse (!) 58   Temp 97.8 F (36.6 C) (Oral)   Resp 17   Ht 6' (1.829 m)   Wt (!) 138.3 kg   SpO2 98%   BMI 41.37 kg/m   Physical Exam  Constitutional: He is oriented to person, place, and time. He appears well-developed and well-nourished.  HENT:  Head: Normocephalic and atraumatic.  Eyes: Conjunctivae are normal. Pupils are  equal, round, and reactive to light. Right eye exhibits no discharge. Left eye exhibits no discharge. No scleral icterus.  Neck: Normal range of motion. No JVD present. No tracheal deviation present.  Pulmonary/Chest: Effort normal. No stridor.  Neurological: He is alert and oriented to person, place, and time. He has normal strength. No cranial nerve deficit or sensory deficit. Coordination normal. GCS eye subscore is 4. GCS verbal subscore is 5. GCS motor subscore is 6.  NIH 0  Skin: Skin is warm.  Numerous insect bites to ankles , abdomen, and upper extremities- various stages of healing. No signs of secondary infection   Psychiatric: He has a normal mood and affect. His behavior is normal. Judgment and thought content normal.  Nursing note and vitals reviewed.   ED Treatments / Results  Labs (all labs ordered are listed, but only abnormal results are displayed) Labs Reviewed - No data to display  EKG  EKG Interpretation None       Radiology No results found.  Procedures Procedures (including critical care time)  Medications Ordered in ED Medications - No data to display   Initial Impression / Assessment and Plan / ED Course  I have reviewed the triage vital signs and the nursing notes.  Pertinent labs & imaging results that were available during my care of the patient were reviewed by me and considered in my medical decision making (see chart for details).  Clinical Course      Final Clinical Impressions(s) / ED Diagnoses   Final diagnoses:  Insect bite, initial encounter  Adverse effect of drug, initial encounter    Labs:   Imaging:  Consults:  Therapeutics:  Discharge Meds:   Assessment/Plan:  45 year old presents today with likely bedbugs. Patient reports he was groggy, but notes taking double the dose of any stroke. Patient has no focal neurological deficits, no significant signs or symptoms of indicate intracranial abnormality. Patient reports that   his grogginess is due to the Benadryl, and "does not need to be here". Patient is requesting discharge home. Patient will be given a permethrin and instructed to use if symptoms continue to persist after careful inspection of house then extremity evaluation. Patient is instructed to avoid taking double the dose of Benadryl, return immediately if he experiences any new or worsening signs or symptoms. Patient was ambulated in the hall with no significant difficulties. Patient verbalized understanding and agreement to today's plan had no further questions or concerns at this time discharge   ew Prescriptions Discharge Medication List as of 05/20/2016 10:12 AM    START taking these medications   Details  permethrin (ELIMITE) 5 % cream Apply to affected area once, Print  Okey Regal, PA-C 05/20/16 1859    Gareth Morgan, MD 05/25/16 [phone]

## 2016-05-20 NOTE — Discharge Instructions (Signed)
Please avoid using Benadryl if it continues to cause significant grogginess. Please contact exterminator and have her house evaluated for bedbugs. If no bed bugs are noted, and you continued to develop rash, please use medication. Please follow-up your primary care for reassessment and evaluation. Please return the emergency room immediately if you experience any new or worsening signs or symptoms.

## 2016-06-30 DIAGNOSIS — M62830 Muscle spasm of back: Secondary | ICD-10-CM | POA: Diagnosis not present

## 2016-07-03 DIAGNOSIS — M549 Dorsalgia, unspecified: Secondary | ICD-10-CM | POA: Diagnosis not present

## 2016-10-07 DIAGNOSIS — M549 Dorsalgia, unspecified: Secondary | ICD-10-CM | POA: Diagnosis not present

## 2016-10-07 DIAGNOSIS — M47892 Other spondylosis, cervical region: Secondary | ICD-10-CM | POA: Diagnosis not present

## 2016-10-07 DIAGNOSIS — M545 Low back pain: Secondary | ICD-10-CM | POA: Diagnosis not present

## 2016-10-07 DIAGNOSIS — M5136 Other intervertebral disc degeneration, lumbar region: Secondary | ICD-10-CM | POA: Diagnosis not present

## 2016-10-07 DIAGNOSIS — M542 Cervicalgia: Secondary | ICD-10-CM | POA: Diagnosis not present

## 2016-11-21 DIAGNOSIS — M5136 Other intervertebral disc degeneration, lumbar region: Secondary | ICD-10-CM | POA: Diagnosis not present

## 2016-11-21 DIAGNOSIS — Z720 Tobacco use: Secondary | ICD-10-CM | POA: Diagnosis not present

## 2016-11-21 DIAGNOSIS — M545 Low back pain: Secondary | ICD-10-CM | POA: Diagnosis not present

## 2016-11-21 DIAGNOSIS — E7800 Pure hypercholesterolemia, unspecified: Secondary | ICD-10-CM | POA: Diagnosis not present

## 2017-01-02 DIAGNOSIS — M5136 Other intervertebral disc degeneration, lumbar region: Secondary | ICD-10-CM | POA: Diagnosis not present

## 2017-01-02 DIAGNOSIS — M545 Low back pain: Secondary | ICD-10-CM | POA: Diagnosis not present

## 2017-01-02 DIAGNOSIS — M722 Plantar fascial fibromatosis: Secondary | ICD-10-CM | POA: Diagnosis not present

## 2017-03-05 DIAGNOSIS — G4733 Obstructive sleep apnea (adult) (pediatric): Secondary | ICD-10-CM | POA: Diagnosis not present

## 2017-03-06 DIAGNOSIS — E7800 Pure hypercholesterolemia, unspecified: Secondary | ICD-10-CM | POA: Diagnosis not present

## 2017-07-08 DIAGNOSIS — G4733 Obstructive sleep apnea (adult) (pediatric): Secondary | ICD-10-CM | POA: Diagnosis not present

## 2017-11-18 DIAGNOSIS — G5602 Carpal tunnel syndrome, left upper limb: Secondary | ICD-10-CM | POA: Diagnosis not present

## 2017-11-18 DIAGNOSIS — G5601 Carpal tunnel syndrome, right upper limb: Secondary | ICD-10-CM | POA: Diagnosis not present

## 2017-11-18 DIAGNOSIS — M65341 Trigger finger, right ring finger: Secondary | ICD-10-CM | POA: Diagnosis not present

## 2017-12-12 DIAGNOSIS — G5602 Carpal tunnel syndrome, left upper limb: Secondary | ICD-10-CM | POA: Diagnosis not present

## 2017-12-12 DIAGNOSIS — G5601 Carpal tunnel syndrome, right upper limb: Secondary | ICD-10-CM | POA: Diagnosis not present

## 2018-03-03 DIAGNOSIS — G4733 Obstructive sleep apnea (adult) (pediatric): Secondary | ICD-10-CM | POA: Diagnosis not present

## 2018-07-27 ENCOUNTER — Encounter: Payer: Self-pay | Admitting: Gastroenterology

## 2022-03-28 ENCOUNTER — Encounter: Payer: Self-pay | Admitting: Gastroenterology

## 2022-04-28 ENCOUNTER — Ambulatory Visit (AMBULATORY_SURGERY_CENTER): Payer: Self-pay | Admitting: *Deleted

## 2022-04-28 VITALS — Ht 73.0 in | Wt 307.0 lb

## 2022-04-28 DIAGNOSIS — Z86010 Personal history of colonic polyps: Secondary | ICD-10-CM

## 2022-04-28 DIAGNOSIS — Z800 Family history of malignant neoplasm of digestive organs: Secondary | ICD-10-CM

## 2022-04-28 MED ORDER — NA SULFATE-K SULFATE-MG SULF 17.5-3.13-1.6 GM/177ML PO SOLN: 1.0000 | Freq: Once | ORAL | 0 refills | Status: AC

## 2022-04-28 NOTE — Progress Notes (Addendum)
No egg or soy allergy known to patient  No issues known to pt with past sedation with any surgeries or procedures Patient denies ever being told they had issues or difficulty with intubation  No FH of Malignant Hyperthermia Pt is not on diet pills Pt is not on  home 02  Pt is not on blood thinners  Pt has issues with constipation occassionally  No A fib or A flutter Have any cardiac testing pending--NO Pt instructed to use Singlecare.com or GoodRx for a price reduction on prep    Patient's chart reviewed by Osvaldo Angst CNRA prior to previsit and patient appropriate for the [facility].  Previsit completed and red dot placed by patient's name on their procedure day (on provider's schedule).

## 2022-05-28 ENCOUNTER — Encounter: Payer: Self-pay | Admitting: Gastroenterology

## 2022-05-30 ENCOUNTER — Ambulatory Visit (AMBULATORY_SURGERY_CENTER): Payer: 59 | Admitting: Gastroenterology

## 2022-05-30 ENCOUNTER — Encounter: Payer: Self-pay | Admitting: Gastroenterology

## 2022-05-30 VITALS — BP 141/80 | HR 54 | Temp 98.7°F | Resp 10 | Ht 73.0 in | Wt 307.0 lb

## 2022-05-30 DIAGNOSIS — D122 Benign neoplasm of ascending colon: Secondary | ICD-10-CM

## 2022-05-30 DIAGNOSIS — K621 Rectal polyp: Secondary | ICD-10-CM

## 2022-05-30 DIAGNOSIS — Z09 Encounter for follow-up examination after completed treatment for conditions other than malignant neoplasm: Secondary | ICD-10-CM | POA: Diagnosis present

## 2022-05-30 DIAGNOSIS — Z86010 Personal history of colonic polyps: Secondary | ICD-10-CM

## 2022-05-30 DIAGNOSIS — D128 Benign neoplasm of rectum: Secondary | ICD-10-CM

## 2022-05-30 DIAGNOSIS — Z800 Family history of malignant neoplasm of digestive organs: Secondary | ICD-10-CM | POA: Diagnosis not present

## 2022-05-30 DIAGNOSIS — K635 Polyp of colon: Secondary | ICD-10-CM

## 2022-05-30 MED ORDER — SODIUM CHLORIDE 0.9 % IV SOLN: 500.0000 mL | Freq: Once | INTRAVENOUS | Status: DC

## 2022-05-30 NOTE — Progress Notes (Signed)
Called to room to assist during endoscopic procedure.  Patient ID and intended procedure confirmed with present staff. Received instructions for my participation in the procedure from the performing physician.  

## 2022-05-30 NOTE — Op Note (Signed)
[facility] Patient Name: Aaron Poole Procedure Date: 05/30/2022 12:11 PM MRN: [id number] Endoscopist: Mauri Pole , MD, [id number] Age: 51 Referring MD:  Date of Birth: [birth year] Gender: Male Account #: [id number] Procedure:                Colonoscopy Indications:              Screening in patient at increased risk: Family                            history of 1st-degree relative with colorectal                            cancer, High risk colon cancer surveillance:                            Personal history of colonic polyps, High risk colon                            cancer surveillance: Personal history of sessile                            serrated colon polyp (less than 10 mm in size) with                            no dysplasia Medicines:                Monitored Anesthesia Care Procedure:                Pre-Anesthesia Assessment:                           - Prior to the procedure, a History and Physical                            was performed, and patient medications and                            allergies were reviewed. The patient's tolerance of                            previous anesthesia was also reviewed. The risks                            and benefits of the procedure and the sedation                            options and risks were discussed with the patient.                            All questions were answered, and informed consent                            was obtained. Prior Anticoagulants: The patient has  taken no anticoagulant or antiplatelet agents. ASA                            Grade Assessment: III - A patient with severe                            systemic disease. After reviewing the risks and                            benefits, the patient was deemed in satisfactory                            condition to undergo the procedure.                           After obtaining informed consent, the  colonoscope                            was passed under direct vision. Throughout the                            procedure, the patient's blood pressure, pulse, and                            oxygen saturations were monitored continuously. The                            Olympus PCF-H190DL ([id number]) Colonoscope was                            introduced through the anus and advanced to the the                            cecum, identified by appendiceal orifice and                            ileocecal valve. The colonoscopy was performed                            without difficulty. The patient tolerated the                            procedure well. The quality of the bowel                            preparation was adequate to identify polyps greater                            than 5 mm in size. The ileocecal valve, appendiceal                            orifice, and rectum were photographed. Scope In: 12:22:01 PM Scope Out: 12:41:47 PM Scope Withdrawal Time: 0 hours 16 minutes 15 seconds  Total Procedure Duration:  0 hours 19 minutes 46 seconds  Findings:                 The perianal and digital rectal examinations were                            normal.                           A 5 mm polyp was found in the transverse colon. The                            polyp was sessile. The polyp was removed with a                            cold snare. Resection and retrieval were complete.                           Three sessile polyps were found in the rectum. The                            polyps were 4 to 7 mm in size. These polyps were                            removed with a cold snare. Resection and retrieval                            were complete.                           Scattered small-mouthed diverticula were found in                            the sigmoid colon and descending colon.                            Peri-diverticular erythema was seen.                            Non-bleeding external and internal hemorrhoids were                            found during retroflexion. The hemorrhoids were                            medium-sized. Complications:            No immediate complications. Estimated Blood Loss:     Estimated blood loss was minimal. Impression:               - One 5 mm polyp in the transverse colon, removed                            with a cold snare. Resected and retrieved.                           -  Three 4 to 7 mm polyps in the rectum, removed                            with a cold snare. Resected and retrieved.                           - Mild diverticulosis in the sigmoid colon and in                            the descending colon. Peri-diverticular erythema                            was seen.                           - Non-bleeding external and internal hemorrhoids. Recommendation:           - Patient has a contact number available for                            emergencies. The signs and symptoms of potential                            delayed complications were discussed with the                            patient. Return to normal activities tomorrow.                            Written discharge instructions were provided to the                            patient.                           - Resume previous diet.                           - Continue present medications.                           - Await pathology results.                           - Repeat colonoscopy in 3 - 5 years for                            surveillance based on pathology results.                           - For future colonoscopy the patient will require                            an extended preparation. If there are any  questions, please contact the gastroenterologist. Mauri Pole, MD 05/30/2022 12:48:59 PM This report has been signed electronically.

## 2022-05-30 NOTE — Progress Notes (Signed)
Pt's states no medical or surgical changes since previsit or office visit. 

## 2022-05-30 NOTE — Progress Notes (Signed)
[facility] History and Physical   Primary Care Physician:  Lennie Odor, PA   Reason for Procedure:  History of adenomatous colon polyps, family h/o colon cancer  Plan:    Surveillance colonoscopy with possible interventions as needed     HPI: Aaron Poole is a very pleasant 51 y.o. male here for surveillance colonoscopy. Denies any nausea, vomiting, abdominal pain, melena or bright red blood per rectum  The risks and benefits as well as alternatives of endoscopic procedure(s) have been discussed and reviewed. All questions answered. The patient agrees to proceed.    Past Medical History:  Diagnosis Date   Anal fissure    Arthritis    GERD (gastroesophageal reflux disease)    High cholesterol    IBS (irritable bowel syndrome)    Neuromuscular disorder (HCC)    Pinched nerve    Sleep apnea     Past Surgical History:  Procedure Laterality Date   COLONOSCOPY     2016   ELBOW SURGERY Right    KNEE SURGERY Bilateral    SHOULDER SURGERY Right    TONSILLECTOMY     age 53   URETHRAL CYST REMOVAL      Prior to Admission medications   Medication Sig Start Date End Date Taking? Authorizing Provider  acetaminophen (TYLENOL) 500 MG tablet Take 500 mg by mouth every 6 (six) hours as needed for mild pain.   Yes [provider]  ibuprofen (ADVIL) 200 MG tablet Take 200 mg by mouth as needed.    [provider]    Current Outpatient Medications  Medication Sig Dispense Refill   acetaminophen (TYLENOL) 500 MG tablet Take 500 mg by mouth every 6 (six) hours as needed for mild pain.     ibuprofen (ADVIL) 200 MG tablet Take 200 mg by mouth as needed.     Current Facility-Administered Medications  Medication Dose Route Frequency Provider Last Rate Last Admin   0.9 %  sodium chloride infusion  500 mL Intravenous Once Mauri Pole, MD        Allergies as of 05/30/2022 - Review Complete 05/30/2022  Allergen Reaction Noted   Aspirin   04/19/2012    Family History  Problem Relation Age of Onset   Colon polyps Mother    Diabetes Mother    COPD Mother    Irritable bowel syndrome Mother    Irritable bowel syndrome Sister    Diabetes Maternal Aunt    Diabetes Maternal Grandfather    Colon cancer Other    Crohn's disease Neg Hx    Esophageal cancer Neg Hx    Rectal cancer Neg Hx    Stomach cancer Neg Hx    Ulcerative colitis Neg Hx     Social History   Socioeconomic History   Marital status: Married    Spouse name: Not on file   Number of children: 5   Years of education: Not on file   Highest education level: Not on file  Occupational History   Occupation: truck driver  Tobacco Use   Smoking status: Every Day    Packs/day: 1.00    Years: 26.00    Total pack years: 26.00    Types: Cigarettes    Passive exposure: Current   Smokeless tobacco: Never  Vaping Use   Vaping Use: Never used  Substance and Sexual Activity   Alcohol use: Yes    Alcohol/week: 0.0 standard drinks of alcohol    Comment: occasionally   Drug use: No  Sexual activity: Not on file  Other Topics Concern   Not on file  Social History Narrative   Not on file   Social Determinants of Health   Financial Resource Strain: Not on file  Food Insecurity: Not on file  Transportation Needs: Not on file  Physical Activity: Not on file  Stress: Not on file  Social Connections: Not on file  Intimate Partner Violence: Not on file    Review of Systems:  All other review of systems negative except as mentioned in the HPI.  Physical Exam: Vital signs in last 24 hours: Blood Pressure (Abnormal) 166/82   Pulse 62   Temperature 98.7 F (37.1 C) (Temporal)   Height '6\' 1"'$  (1.854 m)   Weight (Abnormal) 307 lb (139.3 kg)   Oxygen Saturation 97%   Body Mass Index 40.50 kg/m  General:   Alert, NAD Lungs:  Clear .   Heart:  Regular rate and rhythm Abdomen:  Soft, nontender and nondistended. Neuro/Psych:  Alert and cooperative. Normal  mood and affect. A and O x 3  Reviewed labs, radiology imaging, old records and pertinent past GI work up  Patient is appropriate for planned procedure(s) and anesthesia in an ambulatory setting   K. Denzil Magnuson , MD [phone]

## 2022-05-30 NOTE — Patient Instructions (Addendum)
Handout on polyps, hemorrhoids, and diverticulosis given to patient.  Await pathology results. Resume previous diet and continue present medications. Repeat colonoscopy for surveillance in 3-5 years based off of pathology results. Next colonoscopy will need extended prep.  YOU HAD AN ENDOSCOPIC PROCEDURE TODAY AT [facility]:   Refer to the procedure report that was given to you for any specific questions about what was found during the examination.  If the procedure report does not answer your questions, please call your gastroenterologist to clarify.  If you requested that your care partner not be given the details of your procedure findings, then the procedure report has been included in a sealed envelope for you to review at your convenience later.  YOU SHOULD EXPECT: Some feelings of bloating in the abdomen. Passage of more gas than usual.  Walking can help get rid of the air that was put into your GI tract during the procedure and reduce the bloating. If you had a lower endoscopy (such as a colonoscopy or flexible sigmoidoscopy) you may notice spotting of blood in your stool or on the toilet paper. If you underwent a bowel prep for your procedure, you may not have a normal bowel movement for a few days.  Please Note:  You might notice some irritation and congestion in your nose or some drainage.  This is from the oxygen used during your procedure.  There is no need for concern and it should clear up in a day or so.  SYMPTOMS TO REPORT IMMEDIATELY:  Following lower endoscopy (colonoscopy or flexible sigmoidoscopy):  Excessive amounts of blood in the stool  Significant tenderness or worsening of abdominal pains  Swelling of the abdomen that is new, acute  Fever of 100F or higher  For urgent or emergent issues, a gastroenterologist can be reached at any hour by calling [phone]. Do not use MyChart messaging for urgent concerns.    DIET:  We do recommend a small meal  at first, but then you may proceed to your regular diet.  Drink plenty of fluids but you should avoid alcoholic beverages for 24 hours.  ACTIVITY:  You should plan to take it easy for the rest of today and you should NOT DRIVE or use heavy machinery until tomorrow (because of the sedation medicines used during the test).    FOLLOW UP: Our staff will call the number listed on your records the next business day following your procedure.  We will call around 7:15- 8:00 am to check on you and address any questions or concerns that you may have regarding the information given to you following your procedure. If we do not reach you, we will leave a message.     If any biopsies were taken you will be contacted by phone or by letter within the next 1-3 weeks.  Please call us at [phone] if you have not heard about the biopsies in 3 weeks.    SIGNATURES/CONFIDENTIALITY: You and/or your care partner have signed paperwork which will be entered into your electronic medical record.  These signatures attest to the fact that that the information above on your After Visit Summary has been reviewed and is understood.  Full responsibility of the confidentiality of this discharge information lies with you and/or your care-partner.

## 2022-05-30 NOTE — Progress Notes (Signed)
Report to PACU, RN, vss, BBS= Clear.  

## 2022-06-04 ENCOUNTER — Telehealth: Payer: Self-pay | Admitting: *Deleted

## 2022-06-04 NOTE — Telephone Encounter (Signed)
  Follow up Call-     05/30/2022   11:26 AM  Call back number  Post procedure Call Back phone  # [phone]  Permission to leave phone message Yes     Patient questions:  Message left  to call us if necessary.

## 2022-06-12 ENCOUNTER — Encounter: Payer: Self-pay | Admitting: Gastroenterology

## 2022-12-05 ENCOUNTER — Ambulatory Visit
Admission: RE | Admit: 2022-12-05 | Discharge: 2022-12-05 | Disposition: A | Discharge status: Home / Self Care | Payer: 59 | Source: Ambulatory Visit

## 2022-12-05 ENCOUNTER — Ambulatory Visit (HOSPITAL_COMMUNITY)
Admission: RE | Admit: 2022-12-05 | Discharge: 2022-12-05 | Disposition: A | Discharge status: Home / Self Care | Payer: 59 | Source: Ambulatory Visit | Attending: Family Medicine | Admitting: Family Medicine

## 2022-12-05 VITALS — BP 152/92 | HR 64 | Temp 97.5°F | Resp 18

## 2022-12-05 DIAGNOSIS — R600 Localized edema: Secondary | ICD-10-CM | POA: Diagnosis not present

## 2022-12-05 DIAGNOSIS — R2242 Localized swelling, mass and lump, left lower limb: Secondary | ICD-10-CM | POA: Diagnosis present

## 2022-12-05 DIAGNOSIS — M79605 Pain in left leg: Secondary | ICD-10-CM | POA: Diagnosis not present

## 2022-12-05 HISTORY — DX: Essential (primary) hypertension: I10

## 2022-12-05 NOTE — ED Triage Notes (Signed)
States BP has been high.  States has been taking BP medication and its not been working.  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  States saw PCP in February and was given rosuvastatin and amlodipine.

## 2022-12-05 NOTE — ED Provider Notes (Signed)
[facility]    CSN: [id number] Arrival date & time: 12/05/22  1043      History   Chief Complaint Chief Complaint  Patient presents with   Leg Swelling    Also High Blood Pressure / Cholestorol Maintenance - Entered by patient    HPI Aaron Poole is a 52 y.o. male.   Patient presenting today complaining of a painful swollen region/mass to the lower medial left leg that has been present for several weeks now.  He states that occasionally is red in color.  Denies any injury to the area, leg swelling diffusely to the leg, chest pain, shortness of breath, palpitations, history of DVT.  So far not trying anything over-the-counter for symptoms.  Patient is concerned for blood clot.    Past Medical History:  Diagnosis Date   Anal fissure    Arthritis    GERD (gastroesophageal reflux disease)    High cholesterol    Hypertension    IBS (irritable bowel syndrome)    Neuromuscular disorder (HCC)    Pinched nerve    Sleep apnea     Patient Active Problem List   Diagnosis Date Noted   Abdominal pain, left upper quadrant 02/27/2015   Dysphagia, pharyngoesophageal phase 02/27/2015   Family history of colon cancer 02/27/2015   Rectal bleeding 02/27/2015    Past Surgical History:  Procedure Laterality Date   COLONOSCOPY     2016   ELBOW SURGERY Right    KNEE SURGERY Bilateral    SHOULDER SURGERY Right    TONSILLECTOMY     age 75   URETHRAL CYST REMOVAL         Home Medications    Prior to Admission medications   Medication Sig Start Date End Date Taking? Authorizing Provider  amLODipine (NORVASC) 10 MG tablet Take 10 mg by mouth daily.   Yes [provider]  rosuvastatin (CRESTOR) 5 MG tablet Take 5 mg by mouth daily.   Yes [provider]  acetaminophen (TYLENOL) 500 MG tablet Take 500 mg by mouth every 6 (six) hours as needed for mild pain.    [provider]  ibuprofen (ADVIL) 200 MG tablet Take 200 mg by mouth as needed.     [provider]    Family History Family History  Problem Relation Age of Onset   Colon polyps Mother    Diabetes Mother    COPD Mother    Irritable bowel syndrome Mother    Irritable bowel syndrome Sister    Diabetes Maternal Aunt    Diabetes Maternal Grandfather    Colon cancer Other    Crohn's disease Neg Hx    Esophageal cancer Neg Hx    Rectal cancer Neg Hx    Stomach cancer Neg Hx    Ulcerative colitis Neg Hx     Social History Social History   Tobacco Use   Smoking status: Every Day    Packs/day: 1.00    Years: 26.00    Additional pack years: 0.00    Total pack years: 26.00    Types: Cigarettes    Passive exposure: Current   Smokeless tobacco: Never  Vaping Use   Vaping Use: Never used  Substance Use Topics   Alcohol use: Yes    Alcohol/week: 0.0 standard drinks of alcohol    Comment: occasionally   Drug use: No     Allergies   Aspirin   Review of Systems Review of Systems Per HPI  Physical Exam  Triage Vital Signs ED Triage Vitals  Enc Vitals Group     BP 12/05/22 1115 (!) 152/92     Pulse Rate 12/05/22 1115 64     Resp 12/05/22 1115 18     Temp 12/05/22 1115 (!) 97.5 F (36.4 C)     Temp Source 12/05/22 1115 Oral     SpO2 12/05/22 1115 94 %     Weight --      Height --      Head Circumference --      Peak Flow --      Pain Score 12/05/22 1117 0     Pain Loc --      Pain Edu? --      Excl. in GC? --    No data found.  Updated Vital Signs BP (!) 152/92 (BP Location: Right Arm)   Pulse 64   Temp (!) 97.5 F (36.4 C) (Oral)   Resp 18   SpO2 94%   Visual Acuity Right Eye Distance:   Left Eye Distance:   Bilateral Distance:    Right Eye Near:   Left Eye Near:    Bilateral Near:     Physical Exam Vitals and nursing note reviewed.  Constitutional:      Appearance: Normal appearance.  HENT:     Head: Atraumatic.  Eyes:     Extraocular Movements: Extraocular movements intact.     Conjunctiva/sclera: Conjunctivae  normal.  Cardiovascular:     Rate and Rhythm: Normal rate and regular rhythm.  Pulmonary:     Effort: Pulmonary effort is normal.     Breath sounds: Normal breath sounds.  Musculoskeletal:        General: Normal range of motion.     Cervical back: Normal range of motion and neck supple.     Comments: Negative Denna Haggard' sign left lower extremity.  Normal range of motion  Skin:    General: Skin is warm and dry.     Comments: Semifirm bone mass to the left medial lower leg, mildly tender to palpation.  No discoloration, drainage or other skin changes to the area.  Neurological:     General: No focal deficit present.     Mental Status: He is oriented to person, place, and time.     Comments: Left lower extremity neurovascularly intact  Psychiatric:        Mood and Affect: Mood normal.        Thought Content: Thought content normal.        Judgment: Judgment normal.      UC Treatments / Results  Labs (all labs ordered are listed, but only abnormal results are displayed) Labs Reviewed - No data to display  EKG   Radiology US Venous Img Lower Unilateral Left  Result Date: 12/05/2022 CLINICAL DATA:  Tender mass in the medial left lower extremity. Question DVT. EXAM: Left LOWER EXTREMITY VENOUS DOPPLER ULTRASOUND TECHNIQUE: Gray-scale sonography with compression, as well as color and duplex ultrasound, were performed to evaluate the deep venous system(s) from the level of the common femoral vein through the popliteal and proximal calf veins. COMPARISON:  None Available. FINDINGS: VENOUS Normal compressibility of the common femoral, superficial femoral, and popliteal veins, as well as the visualized calf veins. Visualized portions of profunda femoral vein and great saphenous vein unremarkable. No filling defects to suggest DVT on grayscale or color Doppler imaging. Doppler waveforms show normal direction of venous flow, normal respiratory plasticity and response to augmentation. Limited views  of  the contralateral common femoral vein are unremarkable. OTHER Edematous changes are present the superficial soft tissues. A possible hyperechoic lesion measures 13 x 9 x 5 mm. This is felt to more likely represent some lobulations of the normal subcutaneous fat. No other discrete lesion is present. Limitations: none IMPRESSION: 1. No evidence of left lower extremity DVT. 2. Edematous changes are present in the superficial soft tissues. A possible hyperechoic lesion measures 13 x 9 x 5 mm may represent a lymph node. This is felt to more likely represent some lobulations of the normal subcutaneous fat. No other discrete lesion is present. Electronically Signed   By: Marin Roberts M.D.   On: 12/05/2022 12:45    Procedures Procedures (including critical care time)  Medications Ordered in UC Medications - No data to display  Initial Impression / Assessment and Plan / UC Course  I have reviewed the triage vital signs and the nursing notes.  Pertinent labs & imaging results that were available during my care of the patient were reviewed by me and considered in my medical decision making (see chart for details).     Exam very reassuring with low suspicion for DVT, however given patient's concern we will obtain a DVT rule out ultrasound.  If this is negative, discussed RICE protocol, PCP follow-up for soft tissue ultrasound if not resolving.  Patient to report to [facility] vascular imaging directly after leaving this clinic for lower extremity DVT ultrasound.  Final Clinical Impressions(s) / UC Diagnoses   Final diagnoses:  Leg mass, left  Left leg pain     Discharge Instructions      We are sending you over to [facility] for a vascular ultrasound to ensure that the area of pain and swelling is not indicative of a blood clot.  I do have a fairly low suspicion for this, however better safe than sorry.  If your DVT rule out ultrasound is negative, ice and elevate the area and take  ibuprofen and Tylenol as needed.  Follow-up with your primary care provider for further evaluation as soon as possible and return for significantly worsening symptoms at any time    ED Prescriptions   None    PDMP not reviewed this encounter.   Particia Nearing, [place] 12/05/22 1537

## 2022-12-05 NOTE — Discharge Instructions (Signed)
We are sending you over to [facility] for a vascular ultrasound to ensure that the area of pain and swelling is not indicative of a blood clot.  I do have a fairly low suspicion for this, however better safe than sorry.  If your DVT rule out ultrasound is negative, ice and elevate the area and take ibuprofen and Tylenol as needed.  Follow-up with your primary care provider for further evaluation as soon as possible and return for significantly worsening symptoms at any time

## 2023-01-05 ENCOUNTER — Ambulatory Visit: Payer: 59 | Admitting: Family Medicine

## 2023-01-05 ENCOUNTER — Encounter: Payer: Self-pay | Admitting: Family Medicine

## 2023-01-05 VITALS — BP 138/82 | HR 65 | Ht 72.0 in | Wt 318.0 lb

## 2023-01-05 DIAGNOSIS — I10 Essential (primary) hypertension: Secondary | ICD-10-CM

## 2023-01-05 DIAGNOSIS — Z13220 Encounter for screening for lipoid disorders: Secondary | ICD-10-CM

## 2023-01-05 DIAGNOSIS — Z125 Encounter for screening for malignant neoplasm of prostate: Secondary | ICD-10-CM

## 2023-01-05 DIAGNOSIS — Z1159 Encounter for screening for other viral diseases: Secondary | ICD-10-CM

## 2023-01-05 DIAGNOSIS — Z1329 Encounter for screening for other suspected endocrine disorder: Secondary | ICD-10-CM

## 2023-01-05 DIAGNOSIS — Z122 Encounter for screening for malignant neoplasm of respiratory organs: Secondary | ICD-10-CM

## 2023-01-05 DIAGNOSIS — F17200 Nicotine dependence, unspecified, uncomplicated: Secondary | ICD-10-CM | POA: Insufficient documentation

## 2023-01-05 DIAGNOSIS — Z114 Encounter for screening for human immunodeficiency virus [HIV]: Secondary | ICD-10-CM

## 2023-01-05 DIAGNOSIS — Z131 Encounter for screening for diabetes mellitus: Secondary | ICD-10-CM

## 2023-01-05 NOTE — Assessment & Plan Note (Addendum)
Vitals:   01/05/23 0813 01/05/23 0819  BP: (!) 143/86 138/82  Continue Amlodipine 10 mg  once a day, Labs ordered today Follow up in once week with blood pressure readings to evaluate trends through my chart depending on readings- possible medication changes.  Continued discussion on DASH diet, low sodium diet and maintain a exercise routine for 150 minutes per week.

## 2023-01-05 NOTE — Patient Instructions (Signed)

## 2023-01-05 NOTE — Progress Notes (Signed)
New Patient Office Visit   Subjective   Patient ID: Aaron Poole, male    DOB: 1970-10-12  Age: 52 y.o. MRN: [id number]  CC:  Chief Complaint  Patient presents with   Establish Care   Hypertension    Patient is here to establish care, discuss HTN, lipidemia, and lumps in L leg.     HPI Aaron Poole 52 year old male, presents to establish care. He  has a past medical history of Anal fissure, Arthritis, GERD (gastroesophageal reflux disease), High cholesterol, Hypertension, IBS (irritable bowel syndrome), Neuromuscular disorder (HCC), Pinched nerve, and Sleep apnea.  Hypertension This is a chronic problem. The problem has been waxing and waning since onset. Pertinent negatives include no chest pain, headaches or shortness of breath. Risk factors for coronary artery disease include dyslipidemia, obesity and smoking/tobacco exposure. Past treatments include calcium channel blockers. The current treatment provides moderate improvement. Compliance problems include exercise and diet.       Outpatient Encounter Medications as of 01/05/2023  Medication Sig   acetaminophen (TYLENOL) 500 MG tablet Take 500 mg by mouth every 6 (six) hours as needed for mild pain.   amLODipine (NORVASC) 10 MG tablet Take 10 mg by mouth daily.   ibuprofen (ADVIL) 200 MG tablet Take 200 mg by mouth as needed.   rosuvastatin (CRESTOR) 5 MG tablet Take 5 mg by mouth daily.   No facility-administered encounter medications on file as of 01/05/2023.    Past Surgical History:  Procedure Laterality Date   COLONOSCOPY     2016   ELBOW SURGERY Right    KNEE SURGERY Bilateral    SHOULDER SURGERY Right    TONSILLECTOMY     age 66   URETHRAL CYST REMOVAL      Review of Systems  Constitutional:  Negative for chills and fever.  Respiratory:  Negative for shortness of breath.   Cardiovascular:  Negative for chest pain.  Gastrointestinal:  Negative for abdominal pain.  Skin:  Negative for rash.  Neurological:   Negative for headaches.      Objective    BP 138/82   Pulse 65   Ht 6' (1.829 m)   Wt (!) 318 lb (144.2 kg)   SpO2 94%   BMI 43.13 kg/m   Physical Exam Vitals reviewed.  Constitutional:      General: He is not in acute distress.    Appearance: Normal appearance. He is not ill-appearing, toxic-appearing or diaphoretic.  HENT:     Head: Normocephalic.     Right Ear: Tympanic membrane normal.     Left Ear: Tympanic membrane normal.     Nose: Nose normal.     Mouth/Throat:     Mouth: Mucous membranes are moist.  Eyes:     General:        Right eye: No discharge.        Left eye: No discharge.     Extraocular Movements: Extraocular movements intact.     Conjunctiva/sclera: Conjunctivae normal.     Pupils: Pupils are equal, round, and reactive to light.  Cardiovascular:     Rate and Rhythm: Normal rate.     Pulses: Normal pulses.     Heart sounds: Normal heart sounds.  Pulmonary:     Effort: Pulmonary effort is normal. No respiratory distress.     Breath sounds: Normal breath sounds.  Abdominal:     General: Bowel sounds are normal.     Palpations: Abdomen is soft.  Tenderness: There is no abdominal tenderness. There is no right CVA tenderness, left CVA tenderness or guarding.  Musculoskeletal:        General: Normal range of motion.     Cervical back: Normal range of motion.  Skin:    General: Skin is warm and dry.     Capillary Refill: Capillary refill takes less than 2 seconds.  Neurological:     General: No focal deficit present.     Mental Status: He is alert and oriented to person, place, and time.     Coordination: Coordination normal.     Gait: Gait normal.  Psychiatric:        Mood and Affect: Mood normal.        Behavior: Behavior normal.       Assessment & Plan:  Primary hypertension Assessment & Plan: Vitals:   01/05/23 0813 01/05/23 0819  BP: (!) 143/86 138/82  Continue Amlodipine 10 mg  once a day, Labs ordered today Follow up in once  week with blood pressure readings to evaluate trends through my chart depending on readings- possible medication changes.  Continued discussion on DASH diet, low sodium diet and maintain a exercise routine for 150 minutes per week.   Orders: -     CBC with Differential/Platelet -     CMP14+EGFR -     Microalbumin / creatinine urine ratio  Screening for lipid disorders -     Lipid panel  Screening for diabetes mellitus -     Hemoglobin A1c  Screening for thyroid disorder -     TSH + free T4  Screening for HIV (human immunodeficiency virus) -     HIV Antibody (routine testing w rflx)  Need for hepatitis C screening test -     Hepatitis C antibody  Screening for lung cancer -     Ambulatory Referral for Lung Cancer Scre  Screening for prostate cancer -     PSA    Return in about 3 months (around 04/07/2023), or if symptoms worsen or fail to improve, for hypertension, hyperlipidemia.   Cruzita Lederer Newman Nip, FNP

## 2023-01-06 ENCOUNTER — Other Ambulatory Visit: Payer: Self-pay | Admitting: Family Medicine

## 2023-01-06 MED ORDER — ROSUVASTATIN CALCIUM 20 MG PO TABS: 20.0000 mg | ORAL_TABLET | Freq: Every day | ORAL | 1 refills | Status: DC

## 2023-01-06 MED ORDER — METFORMIN HCL 500 MG PO TABS: 500.0000 mg | ORAL_TABLET | Freq: Every day | ORAL | 2 refills | Status: DC

## 2023-01-18 ENCOUNTER — Encounter: Payer: Self-pay | Admitting: Family Medicine

## 2023-01-20 ENCOUNTER — Other Ambulatory Visit: Payer: Self-pay | Admitting: Family Medicine

## 2023-01-20 MED ORDER — OLMESARTAN MEDOXOMIL 20 MG PO TABS: 10.0000 mg | ORAL_TABLET | Freq: Every day | ORAL | 3 refills | Status: DC

## 2023-02-01 ENCOUNTER — Other Ambulatory Visit: Payer: Self-pay | Admitting: Family Medicine

## 2023-02-01 MED ORDER — GLIMEPIRIDE 2 MG PO TABS: 2.0000 mg | ORAL_TABLET | Freq: Every day | ORAL | 1 refills | Status: DC

## 2023-02-27 ENCOUNTER — Encounter: Payer: Self-pay | Admitting: Emergency Medicine

## 2023-04-06 ENCOUNTER — Ambulatory Visit: Payer: 59 | Admitting: Family Medicine

## 2023-04-22 NOTE — Progress Notes (Unsigned)
   Established Patient Office Visit   Subjective  Patient ID: Aaron Poole, male    DOB: [birth year]  Age: 52 y.o. MRN: [id number]  No chief complaint on file.   He  has a past medical history of Anal fissure, Arthritis, GERD (gastroesophageal reflux disease), High cholesterol, Hypertension, IBS (irritable bowel syndrome), Neuromuscular disorder (HCC), Pinched nerve, and Sleep apnea.  HPI  ROS    Objective:     There were no vitals taken for this visit. {Vitals History (Optional):23777}  Physical Exam   No results found for any visits on 04/23/23.  The 10-year ASCVD risk score (Arnett DK, et al., 2019) is: 15.1%    Assessment & Plan:  There are no diagnoses linked to this encounter.  No follow-ups on file.   Cruzita Lederer Newman Nip, FNP

## 2023-04-22 NOTE — Patient Instructions (Signed)

## 2023-04-23 ENCOUNTER — Ambulatory Visit: Payer: 59 | Admitting: Family Medicine

## 2023-04-23 ENCOUNTER — Encounter: Payer: Self-pay | Admitting: Family Medicine

## 2023-04-23 VITALS — BP 136/87 | HR 61 | Ht 72.0 in | Wt 323.0 lb

## 2023-04-23 DIAGNOSIS — E119 Type 2 diabetes mellitus without complications: Secondary | ICD-10-CM | POA: Diagnosis not present

## 2023-04-23 DIAGNOSIS — I10 Essential (primary) hypertension: Secondary | ICD-10-CM

## 2023-04-23 DIAGNOSIS — M7750 Other enthesopathy of unspecified foot: Secondary | ICD-10-CM

## 2023-04-23 MED ORDER — PREDNISONE 20 MG PO TABS: 20.0000 mg | ORAL_TABLET | Freq: Two times a day (BID) | ORAL | 0 refills | Status: AC

## 2023-04-23 MED ORDER — OZEMPIC (0.25 OR 0.5 MG/DOSE) 2 MG/3ML ~~LOC~~ SOPN: 0.2500 mg | PEN_INJECTOR | SUBCUTANEOUS | 0 refills | Status: DC

## 2023-04-23 NOTE — Assessment & Plan Note (Signed)
Vitals:   04/23/23 1031  BP: 136/87   Continue Olmesartan 20 mg once daily Labs ordered. Discussed with  patient to monitor their blood pressure regularly and maintain a heart-healthy diet rich in fruits, vegetables, whole grains, and low-fat dairy, while reducing sodium intake to less than 2,300 mg per day. Regular physical activity, such as 30 minutes of moderate exercise most days of the week, will help lower blood pressure and improve overall cardiovascular health. Avoiding smoking, limiting alcohol consumption, and managing stress. Take  prescribed medication, & take it as directed and avoid skipping doses. Seek emergency care if your blood pressure is (over 180/100) or you experience chest pain, shortness of breath, or sudden vision changes.Patient verbalizes understanding regarding plan of care and all questions answered.

## 2023-04-23 NOTE — Assessment & Plan Note (Signed)
Last Hemoglobin A1c: 6.4 Labs: Ordered today, results pending; will follow up accordingly. The patient reports adhering to prescribed medications: Glimepiride 2 mg daily before breakfast, Patient can not tolerate Metformin due GI side effects.  Reviewed non-pharmacological interventions, including a balanced diet rich in lean proteins, healthy fats, whole grains, and high-fiber vegetables. Emphasized reducing refined sugars and processed carbohydrates, and incorporating more fruits, leafy greens, and legumes. Education: Patient was educated on recognizing signs and symptoms of both hypoglycemia and hyperglycemia, and advised to seek emergency care if these symptoms occur. Follow-Up: Scheduled for follow-up in 3-4 months, or sooner if needed. Patient Understanding: The patient verbalized understanding of the care plan, and all questions were answered. Additional Care: Ophthalmology referral was placed. Foot exam results were within normal limits.

## 2023-04-23 NOTE — Assessment & Plan Note (Addendum)
Patient reports pain and tenderness around the ankle consistent with tendonitis. Symptoms include swelling, stiffness, and discomfort with movement, particularly during activities that place stress on the ankle. The area is tender to palpation along the affected tendon. Patient denies recent trauma but reports increased activity levels recently. No signs of joint instability or acute injury are present.   Prednisone 20 mg twice daily x 5 days I advise to rest the affected area and avoid activities that.Gentle stretching and strengthening exercises can also aid recovery but should be introduced gradually as pain subsides.

## 2023-04-24 LAB — LIPID PANEL
Chol/HDL Ratio: 3.5 ratio (ref 0.0–5.0)
Cholesterol, Total: 149 mg/dL (ref 100–199)
HDL: 42 mg/dL (ref >39)
LDL Chol Calc (NIH): 86 mg/dL (ref 0–99)
Triglycerides: 114 mg/dL (ref 0–149)
VLDL Cholesterol Cal: 21 mg/dL (ref 5–40)

## 2023-04-24 LAB — CBC WITH DIFFERENTIAL/PLATELET
Basophils Absolute: 0.1 10*3/uL (ref 0.0–0.2)
Basos: 1 %
EOS (ABSOLUTE): 0.4 10*3/uL (ref 0.0–0.4)
Eos: 5 %
Hematocrit: 46.7 % (ref 37.5–51.0)
Hemoglobin: 14.9 g/dL (ref 13.0–17.7)
Immature Grans (Abs): 0 10*3/uL (ref 0.0–0.1)
Immature Granulocytes: 0 %
Lymphocytes Absolute: 4.1 10*3/uL — ABNORMAL HIGH (ref 0.7–3.1)
Lymphs: 44 %
MCH: 29.6 pg (ref 26.6–33.0)
MCHC: 31.9 g/dL (ref 31.5–35.7)
MCV: 93 fL (ref 79–97)
Monocytes Absolute: 0.9 10*3/uL (ref 0.1–0.9)
Monocytes: 10 %
Neutrophils Absolute: 3.6 10*3/uL (ref 1.4–7.0)
Neutrophils: 40 %
Platelets: 328 10*3/uL (ref 150–450)
RBC: 5.03 x10E6/uL (ref 4.14–5.80)
RDW: 13 % (ref 11.6–15.4)
WBC: 9.2 10*3/uL (ref 3.4–10.8)

## 2023-04-24 LAB — BMP8+EGFR
BUN/Creatinine Ratio: 13 (ref 9–20)
BUN: 11 mg/dL (ref 6–24)
CO2: 25 mmol/L (ref 20–29)
Calcium: 9.7 mg/dL (ref 8.7–10.2)
Chloride: 103 mmol/L (ref 96–106)
Creatinine, Ser: 0.87 mg/dL (ref 0.76–1.27)
Glucose: 79 mg/dL (ref 70–99)
Potassium: 5.3 mmol/L — ABNORMAL HIGH (ref 3.5–5.2)
Sodium: 142 mmol/L (ref 134–144)
eGFR: 105 mL/min/{1.73_m2} (ref >59)

## 2023-04-24 LAB — HEMOGLOBIN A1C
Est. average glucose Bld gHb Est-mCnc: 128 mg/dL
Hgb A1c MFr Bld: 6.1 % — ABNORMAL HIGH (ref 4.8–5.6)

## 2023-04-29 ENCOUNTER — Telehealth: Payer: Self-pay

## 2023-04-29 NOTE — Telephone Encounter (Signed)
Awaiting a call back

## 2023-04-29 NOTE — Telephone Encounter (Signed)
Returned call lvm with referral information -Sent to [facility] [phone]

## 2023-05-05 ENCOUNTER — Other Ambulatory Visit: Payer: Self-pay | Admitting: Family Medicine

## 2023-05-05 ENCOUNTER — Encounter: Payer: Self-pay | Admitting: Family Medicine

## 2023-05-05 NOTE — Telephone Encounter (Signed)
I'm glad prednisone worked well for your tendinitis. Since it's not a long-term solution, we can explore other options.  One option is Mobic (meloxicam), which you can take as needed for pain. However, since it can be hard on the kidneys, it's important to use it sparingly. We can also consider physical therapy, topical treatments, or supplements like turmeric to help manage symptoms.

## 2023-05-18 ENCOUNTER — Other Ambulatory Visit: Payer: Self-pay | Admitting: Family Medicine

## 2023-05-18 DIAGNOSIS — E119 Type 2 diabetes mellitus without complications: Secondary | ICD-10-CM

## 2023-06-13 ENCOUNTER — Other Ambulatory Visit: Payer: Self-pay | Admitting: Family Medicine

## 2023-06-15 ENCOUNTER — Other Ambulatory Visit: Payer: Self-pay | Admitting: Family Medicine

## 2023-06-15 DIAGNOSIS — E119 Type 2 diabetes mellitus without complications: Secondary | ICD-10-CM

## 2023-07-11 ENCOUNTER — Other Ambulatory Visit: Payer: Self-pay | Admitting: Family Medicine

## 2023-08-06 ENCOUNTER — Other Ambulatory Visit: Payer: Self-pay | Admitting: Family Medicine

## 2023-08-06 DIAGNOSIS — E119 Type 2 diabetes mellitus without complications: Secondary | ICD-10-CM

## 2023-08-06 MED ORDER — OZEMPIC (0.25 OR 0.5 MG/DOSE) 2 MG/3ML ~~LOC~~ SOPN: 0.5000 mg | PEN_INJECTOR | SUBCUTANEOUS | 0 refills | Status: DC

## 2023-08-20 ENCOUNTER — Ambulatory Visit: Payer: 59 | Admitting: Family Medicine

## 2023-08-20 ENCOUNTER — Encounter: Payer: Self-pay | Admitting: Family Medicine

## 2023-08-20 VITALS — BP 130/72 | HR 64 | Ht 73.0 in | Wt 321.0 lb

## 2023-08-20 DIAGNOSIS — Z7984 Long term (current) use of oral hypoglycemic drugs: Secondary | ICD-10-CM | POA: Diagnosis not present

## 2023-08-20 DIAGNOSIS — E119 Type 2 diabetes mellitus without complications: Secondary | ICD-10-CM

## 2023-08-20 DIAGNOSIS — E559 Vitamin D deficiency, unspecified: Secondary | ICD-10-CM

## 2023-08-20 DIAGNOSIS — Z122 Encounter for screening for malignant neoplasm of respiratory organs: Secondary | ICD-10-CM

## 2023-08-20 DIAGNOSIS — I10 Essential (primary) hypertension: Secondary | ICD-10-CM

## 2023-08-20 MED ORDER — GABAPENTIN 300 MG PO CAPS: 300.0000 mg | ORAL_CAPSULE | Freq: Two times a day (BID) | ORAL | 3 refills | Status: DC | PRN

## 2023-08-20 NOTE — Progress Notes (Signed)
 Established Patient Office Visit   Subjective  Patient ID: Aaron Poole, male    DOB: [birth year]  Age: 53 y.o. MRN: [id number]  Chief Complaint  Patient presents with   Follow-up    4 month for hyperlipidemia, type 2 diabetes, hypertension. Stopped taking Ozempic due to prescription cancellation     He  has a past medical history of Anal fissure, Arthritis, GERD (gastroesophageal reflux disease), High cholesterol, Hypertension, IBS (irritable bowel syndrome), Neuromuscular disorder (HCC), Pinched nerve, and Sleep apnea.  Diabetes He presents for his follow-up diabetic visit. He has type 2 diabetes mellitus. Pertinent negatives for hypoglycemia include no headaches or tremors. Pertinent negatives for diabetes include no blurred vision, no chest pain and no foot ulcerations. Pertinent negatives for hypoglycemia complications include no blackouts. Diabetic complications include peripheral neuropathy. Risk factors for coronary artery disease include diabetes mellitus, dyslipidemia, male sex, obesity and tobacco exposure. Current diabetic treatment includes oral agent (monotherapy). He is compliant with treatment all of the time. He is following a diabetic diet. Meal planning includes calorie counting. He has not had a previous visit with a dietitian. He participates in exercise intermittently. An ACE inhibitor/angiotensin II receptor blocker is being taken. He does not see a podiatrist.Eye exam is current.    Review of Systems  Constitutional:  Negative for chills and fever.  Eyes:  Negative for blurred vision.  Respiratory:  Negative for shortness of breath.   Cardiovascular:  Negative for chest pain.  Neurological:  Negative for tremors and headaches.      Objective:     BP (!) 152/90   Pulse 64   Ht 6\' 1"  (1.854 m)   Wt (!) 321 lb 0.6 oz (145.6 kg)   SpO2 94%   BMI 42.36 kg/m  BP Readings from Last 3 Encounters:  08/20/23 (!) 152/90  04/23/23 136/87  01/05/23 138/82       Physical Exam Vitals reviewed.  Constitutional:      General: He is not in acute distress.    Appearance: Normal appearance. He is not ill-appearing, toxic-appearing or diaphoretic.  HENT:     Head: Normocephalic.     Right Ear: Tympanic membrane normal.     Left Ear: Tympanic membrane normal.  Eyes:     General:        Right eye: No discharge.        Left eye: No discharge.     Conjunctiva/sclera: Conjunctivae normal.     Pupils: Pupils are equal, round, and reactive to light.  Cardiovascular:     Rate and Rhythm: Normal rate.     Pulses: Normal pulses.     Heart sounds: Normal heart sounds.  Pulmonary:     Effort: Pulmonary effort is normal. No respiratory distress.     Breath sounds: Normal breath sounds.  Abdominal:     General: Bowel sounds are normal.     Palpations: Abdomen is soft.     Tenderness: There is no abdominal tenderness. There is no right CVA tenderness, left CVA tenderness or guarding.  Musculoskeletal:        General: Normal range of motion.     Cervical back: Normal range of motion.  Skin:    General: Skin is warm and dry.     Capillary Refill: Capillary refill takes less than 2 seconds.  Neurological:     Mental Status: He is alert.     Coordination: Coordination normal.     Gait: Gait normal.  Psychiatric:  Mood and Affect: Mood normal.        Behavior: Behavior normal.      No results found for any visits on 08/20/23.  The 10-year ASCVD risk score (Arnett DK, et al., 2019) is: 18.9%    Assessment & Plan:  Primary hypertension Assessment & Plan: Continue Olmesartan 20 mg once daily and Amlodipine 10 mg once daily Labs ordered. Discussed with  patient to monitor their blood pressure regularly and maintain a heart-healthy diet rich in fruits, vegetables, whole grains, and low-fat dairy, while reducing sodium intake to less than 2,300 mg per day. Regular physical activity, such as 30 minutes of moderate exercise most days of the week,  will help lower blood pressure and improve overall cardiovascular health. Avoiding smoking, limiting alcohol consumption, and managing stress. Take  prescribed medication, & take it as directed and avoid skipping doses. Seek emergency care if your blood pressure is (over 180/100) or you experience chest pain, shortness of breath, or sudden vision changes.Patient verbalizes understanding regarding plan of care and all questions answered.   Orders: -     BMP8+eGFR -     CBC with Differential/Platelet -     Lipid panel  Vitamin D deficiency -     VITAMIN D 25 Hydroxy (Vit-D Deficiency, Fractures)  Type 2 diabetes mellitus without complication, without long-term current use of insulin (HCC) Assessment & Plan: Last Hemoglobin A1c: 6.1 Labs: Ordered today, results pending; will follow up accordingly. The patient reports adhering to prescribed medications: Glimepiride 2 mg once daily, patient stop taking ozempic due to side effects about 2 weeks ago  Reviewed non-pharmacological interventions, including a balanced diet rich in lean proteins, healthy fats, whole grains, and high-fiber vegetables. Emphasized reducing refined sugars and processed carbohydrates, and incorporating more fruits, leafy greens, and legumes. Education: Patient was educated on recognizing signs and symptoms of both hypoglycemia and hyperglycemia, and advised to seek emergency care if these symptoms occur. Follow-Up: Scheduled for follow-up in 3-4 months, or sooner if needed. Patient Understanding: The patient verbalized understanding of the care plan, and all questions were answered. Additional Care: Ophthalmology exam current. Foot exam results were within normal limits.   Orders: -     Hemoglobin A1c  Screening for lung cancer -     Ambulatory Referral for Lung Cancer Scre  Other orders -     Gabapentin; Take 1 capsule (300 mg total) by mouth 2 (two) times daily as needed.  Dispense: 60 capsule; Refill: 3    Return  in about 6 months (around 02/20/2024), or if symptoms worsen or fail to improve, for hyperlipidemia, hypertension, type 2 diabetes.   Cruzita Lederer Newman Nip, FNP

## 2023-08-20 NOTE — Assessment & Plan Note (Signed)
 Continue Olmesartan 20 mg once daily and Amlodipine 10 mg once daily Labs ordered. Discussed with  patient to monitor their blood pressure regularly and maintain a heart-healthy diet rich in fruits, vegetables, whole grains, and low-fat dairy, while reducing sodium intake to less than 2,300 mg per day. Regular physical activity, such as 30 minutes of moderate exercise most days of the week, will help lower blood pressure and improve overall cardiovascular health. Avoiding smoking, limiting alcohol consumption, and managing stress. Take  prescribed medication, & take it as directed and avoid skipping doses. Seek emergency care if your blood pressure is (over 180/100) or you experience chest pain, shortness of breath, or sudden vision changes.Patient verbalizes understanding regarding plan of care and all questions answered.

## 2023-08-20 NOTE — Patient Instructions (Signed)

## 2023-08-20 NOTE — Assessment & Plan Note (Signed)
 Last Hemoglobin A1c: 6.1 Labs: Ordered today, results pending; will follow up accordingly. The patient reports adhering to prescribed medications: Glimepiride 2 mg once daily, patient stop taking ozempic due to side effects about 2 weeks ago  Reviewed non-pharmacological interventions, including a balanced diet rich in lean proteins, healthy fats, whole grains, and high-fiber vegetables. Emphasized reducing refined sugars and processed carbohydrates, and incorporating more fruits, leafy greens, and legumes. Education: Patient was educated on recognizing signs and symptoms of both hypoglycemia and hyperglycemia, and advised to seek emergency care if these symptoms occur. Follow-Up: Scheduled for follow-up in 3-4 months, or sooner if needed. Patient Understanding: The patient verbalized understanding of the care plan, and all questions were answered. Additional Care: Ophthalmology exam current. Foot exam results were within normal limits.

## 2023-08-21 ENCOUNTER — Encounter: Payer: Self-pay | Admitting: Family Medicine

## 2023-08-21 LAB — CBC WITH DIFFERENTIAL/PLATELET
Basophils Absolute: 0.1 10*3/uL (ref 0.0–0.2)
Basos: 1 %
EOS (ABSOLUTE): 0.4 10*3/uL (ref 0.0–0.4)
Eos: 6 %
Hematocrit: 43.9 % (ref 37.5–51.0)
Hemoglobin: 14.5 g/dL (ref 13.0–17.7)
Immature Grans (Abs): 0 10*3/uL (ref 0.0–0.1)
Immature Granulocytes: 0 %
Lymphocytes Absolute: 3.1 10*3/uL (ref 0.7–3.1)
Lymphs: 41 %
MCH: 29.8 pg (ref 26.6–33.0)
MCHC: 33 g/dL (ref 31.5–35.7)
MCV: 90 fL (ref 79–97)
Monocytes Absolute: 0.7 10*3/uL (ref 0.1–0.9)
Monocytes: 9 %
Neutrophils Absolute: 3.3 10*3/uL (ref 1.4–7.0)
Neutrophils: 43 %
Platelets: 291 10*3/uL (ref 150–450)
RBC: 4.87 x10E6/uL (ref 4.14–5.80)
RDW: 13 % (ref 11.6–15.4)
WBC: 7.6 10*3/uL (ref 3.4–10.8)

## 2023-08-21 LAB — BMP8+EGFR
BUN/Creatinine Ratio: 11 (ref 9–20)
BUN: 10 mg/dL (ref 6–24)
CO2: 21 mmol/L (ref 20–29)
Calcium: 9.5 mg/dL (ref 8.7–10.2)
Chloride: 105 mmol/L (ref 96–106)
Creatinine, Ser: 0.9 mg/dL (ref 0.76–1.27)
Glucose: 87 mg/dL (ref 70–99)
Potassium: 4.9 mmol/L (ref 3.5–5.2)
Sodium: 143 mmol/L (ref 134–144)
eGFR: 103 mL/min/{1.73_m2} (ref >59)

## 2023-08-21 LAB — LIPID PANEL
Chol/HDL Ratio: 3.1 ratio (ref 0.0–5.0)
Cholesterol, Total: 116 mg/dL (ref 100–199)
HDL: 37 mg/dL — ABNORMAL LOW (ref >39)
LDL Chol Calc (NIH): 60 mg/dL (ref 0–99)
Triglycerides: 104 mg/dL (ref 0–149)
VLDL Cholesterol Cal: 19 mg/dL (ref 5–40)

## 2023-08-21 LAB — VITAMIN D 25 HYDROXY (VIT D DEFICIENCY, FRACTURES): Vit D, 25-Hydroxy: 10 ng/mL — ABNORMAL LOW (ref 30.0–100.0)

## 2023-08-21 LAB — HEMOGLOBIN A1C
Est. average glucose Bld gHb Est-mCnc: 131 mg/dL
Hgb A1c MFr Bld: 6.2 % — ABNORMAL HIGH (ref 4.8–5.6)

## 2023-09-01 ENCOUNTER — Other Ambulatory Visit: Payer: Self-pay | Admitting: Family Medicine

## 2023-09-01 DIAGNOSIS — E119 Type 2 diabetes mellitus without complications: Secondary | ICD-10-CM

## 2023-09-01 MED ORDER — OZEMPIC (0.25 OR 0.5 MG/DOSE) 2 MG/3ML ~~LOC~~ SOPN: 0.5000 mg | PEN_INJECTOR | SUBCUTANEOUS | 0 refills | Status: DC

## 2023-09-06 ENCOUNTER — Other Ambulatory Visit: Payer: Self-pay | Admitting: Family Medicine

## 2023-09-27 ENCOUNTER — Other Ambulatory Visit: Payer: Self-pay | Admitting: Family Medicine

## 2023-09-27 DIAGNOSIS — E119 Type 2 diabetes mellitus without complications: Secondary | ICD-10-CM

## 2023-09-30 ENCOUNTER — Other Ambulatory Visit: Payer: Self-pay | Admitting: Family Medicine

## 2023-09-30 MED ORDER — SEMAGLUTIDE (1 MG/DOSE) 4 MG/3ML ~~LOC~~ SOPN: 1.0000 mg | PEN_INJECTOR | SUBCUTANEOUS | 0 refills | Status: DC

## 2023-11-25 ENCOUNTER — Telehealth: Payer: Self-pay | Admitting: Family Medicine

## 2023-11-25 NOTE — Telephone Encounter (Signed)
 Called patient left a voicemail to reach our office to schedule a diabetic eye exam.

## 2023-12-04 ENCOUNTER — Ambulatory Visit
Admission: EM | Admit: 2023-12-04 | Discharge: 2023-12-04 | Disposition: A | Discharge status: Home / Self Care | Attending: Family Medicine | Admitting: Family Medicine

## 2023-12-04 DIAGNOSIS — R600 Localized edema: Secondary | ICD-10-CM

## 2023-12-04 NOTE — ED Triage Notes (Signed)
 Pt reports bilateral swelling in the calves and ankles and swelling in the neck across the back and down the right side. Has caused numbness in the hands. Pt was recently put on gabapentin .

## 2023-12-04 NOTE — Discharge Instructions (Signed)
 We have obtained some blood work today and we will let you know if any of your results come back abnormal.  These will also automatically released to your MyChart account for you to view.  As discussed, continue holding the gabapentin  until you are able to talk with your primary care provider.  Drink plenty of water, eat a low-sodium diet, elevate your legs at rest and may wear compression stockings if desired.  Follow-up for significantly worsening symptoms.

## 2023-12-04 NOTE — ED Provider Notes (Signed)
 [facility]    CSN: [id number] Arrival date & time: 12/04/23  1723      History   Chief Complaint No chief complaint on file.   HPI Aaron Poole is a 53 y.o. male.   Patient presenting today with diffuse edema worse in the peripheral extremities over the past 5 days or so.  Denies associated chest pain, shortness of breath, new numbness tingling or weakness of extremities, headache, visual change, dizziness.  So far not trying anything over-the-counter for symptoms.  Only new medication recently was he was recently put on gabapentin , took it for a week and then stopped it on Sunday night just prior to onset of swelling.  States he has a family history of side effects to gabapentin.  Denies any edema issues at baseline.  Past medical history significant for type 2 diabetes, arthritis, GERD, hypertension, hyperlipidemia, sleep apnea, cigarette smoking.    Past Medical History:  Diagnosis Date   Anal fissure    Arthritis    GERD (gastroesophageal reflux disease)    High cholesterol    Hypertension    IBS (irritable bowel syndrome)    Neuromuscular disorder (HCC)    Pinched nerve    Sleep apnea     Patient Active Problem List   Diagnosis Date Noted   Tendonitis of ankle 04/23/2023   Type 2 diabetes mellitus (HCC) 04/23/2023   Tobacco dependence 01/05/2023   Hypertension 01/05/2023   Dysphagia, pharyngoesophageal phase 02/27/2015   Family history of colon cancer 02/27/2015   Rectal bleeding 02/27/2015    Past Surgical History:  Procedure Laterality Date   COLONOSCOPY     20 16   ELBOW SURGERY Right    KNEE SURGERY Bilateral    SHOULDER SURGERY Right    TONSILLECTOMY     age 55   URETHRAL CYST REMOVAL         Home Medications    Prior to Admission medications   Medication Sig Start Date End Date Taking? Authorizing Provider  acetaminophen  (TYLENOL ) 500 MG tablet Take 500 mg by mouth every 6 (six) hours as needed for mild pain.    [provider]  amLODipine (NORVASC) 10 MG tablet Take 10 mg by mouth daily.    [provider]  gabapentin  (NEURONTIN ) 300 MG capsule Take 1 capsule (300 mg total) by mouth 2 (two) times daily as needed. 08/20/23   Del Orbe Polanco, Hilario, FNP  glimepiride  (AMARYL ) 2 MG tablet TAKE 1 TABLET BY MOUTH DAILY BEFORE BREAKFAST. 07/13/23   Del Orbe Polanco, Iliana, FNP  ibuprofen (ADVIL) 200 MG tablet Take 200 mg by mouth as needed.    [provider]  olmesartan  (BENICAR ) 20 MG tablet TAKE 1/2 TABLET BY MOUTH DAILY 09/07/23   Del Wilhelmena Falter, Iliana, FNP  rosuvastatin  (CRESTOR ) 20 MG tablet TAKE 1 TABLET BY MOUTH EVERY DAY 06/15/23   Del Wilhelmena Falter, Hilario, FNP  Semaglutide , 1 MG/DOSE, 4 MG/3ML SOPN Inject 1 mg as directed once a week. 09/30/23   Del Wilhelmena Falter Hilario, FNP    Family History Family History  Problem Relation Age of Onset   Colon polyps Mother    Diabetes Mother    COPD Mother    Irritable bowel syndrome Mother    Irritable bowel syndrome Sister    Diabetes Maternal Aunt    Diabetes Maternal Grandfather    Colon cancer Other    Crohn's disease Neg Hx    Esophageal cancer Neg Hx    Rectal  cancer Neg Hx    Stomach cancer Neg Hx    Ulcerative colitis Neg Hx     Social History Social History   Tobacco Use   Smoking status: Every Day    Current packs/day: 1.50    Average packs/day: 1.5 packs/day for 33.5 years (50.2 ttl pk-yrs)    Types: Cigarettes    Start date: 1992    Passive exposure: Current   Smokeless tobacco: Never  Vaping Use   Vaping status: Never Used  Substance Use Topics   Alcohol use: Yes    Alcohol/week: 0.0 standard drinks of alcohol    Comment: occasionally   Drug use: No     Allergies   Aspirin, Wasp venom protein, and Ezetimibe   Review of Systems Review of Systems Per HPI  Physical Exam Triage Vital Signs ED Triage Vitals  Encounter Vitals Group     BP 12/04/23 1724 136/84     Girls Systolic BP Percentile --       Girls Diastolic BP Percentile --      Boys Systolic BP Percentile --      Boys Diastolic BP Percentile --      Pulse Rate 12/04/23 1724 69     Resp 12/04/23 1724 20     Temp 12/04/23 1724 98.3 F (36.8 C)     Temp Source 12/04/23 1724 Oral     SpO2 12/04/23 1724 94 %     Weight --      Height --      Head Circumference --      Peak Flow --      Pain Score 12/04/23 1728 8     Pain Loc --      Pain Education --      Exclude from Growth Chart --    No data found.  Updated Vital Signs BP 136/84 (BP Location: Right Arm)   Pulse 69   Temp 98.3 F (36.8 C) (Oral)   Resp 20   SpO2 94%   Visual Acuity Right Eye Distance:   Left Eye Distance:   Bilateral Distance:    Right Eye Near:   Left Eye Near:    Bilateral Near:     Physical Exam Vitals and nursing note reviewed.  Constitutional:      Appearance: Normal appearance.  HENT:     Head: Atraumatic.     Mouth/Throat:     Mouth: Mucous membranes are moist.   Eyes:     Extraocular Movements: Extraocular movements intact.     Conjunctiva/sclera: Conjunctivae normal.    Cardiovascular:     Rate and Rhythm: Normal rate and regular rhythm.  Pulmonary:     Effort: Pulmonary effort is normal. No respiratory distress.     Breath sounds: Normal breath sounds. No wheezing or rales.   Musculoskeletal:        General: Normal range of motion.     Cervical back: Normal range of motion and neck supple.     Comments: Trace edema noted to bilateral lower legs, hands.  Symmetric bilaterally   Skin:    General: Skin is warm and dry.     Findings: No bruising, erythema or rash.   Neurological:     Mental Status: He is oriented to person, place, and time.     Motor: No weakness.     Gait: Gait normal.     Comments: All 4 extremities neurovascularly intact  Psychiatric:        Mood and Affect:  Mood normal.        Thought Content: Thought content normal.        Judgment: Judgment normal.      UC Treatments / Results   Labs (all labs ordered are listed, but only abnormal results are displayed) Labs Reviewed  CBC WITH DIFFERENTIAL/PLATELET  COMPREHENSIVE METABOLIC PANEL WITH GFR    EKG   Radiology No results found.  Procedures Procedures (including critical care time)  Medications Ordered in UC Medications - No data to display  Initial Impression / Assessment and Plan / UC Course  I have reviewed the triage vital signs and the nursing notes.  Pertinent labs & imaging results that were available during my care of the patient were reviewed by me and considered in my medical decision making (see chart for details).     Vital signs and exam very reassuring today, possibly side effect from gabapentin  but will obtain basic labs for further evaluation and rule out.  Discussed low-sodium diet, increasing water intake, compression stockings, leg elevation and close PCP follow-up.  ED for worsening symptoms at any time.  Final Clinical Impressions(s) / UC Diagnoses   Final diagnoses:  Peripheral edema     Discharge Instructions      We have obtained some blood work today and we will let you know if any of your results come back abnormal.  These will also automatically released to your MyChart account for you to view.  As discussed, continue holding the gabapentin  until you are able to talk with your primary care provider.  Drink plenty of water, eat a low-sodium diet, elevate your legs at rest and may wear compression stockings if desired.  Follow-up for significantly worsening symptoms.    ED Prescriptions   None    PDMP not reviewed this encounter.   Stuart Vernell Norris, [place] 12/04/23 1845

## 2023-12-05 LAB — CBC WITH DIFFERENTIAL/PLATELET
Basophils Absolute: 0.1 10*3/uL (ref 0.0–0.2)
Basos: 1 %
EOS (ABSOLUTE): 0.5 10*3/uL — ABNORMAL HIGH (ref 0.0–0.4)
Eos: 4 %
Hematocrit: 46.6 % (ref 37.5–51.0)
Hemoglobin: 15.6 g/dL (ref 13.0–17.7)
Immature Grans (Abs): 0 10*3/uL (ref 0.0–0.1)
Immature Granulocytes: 0 %
Lymphocytes Absolute: 4.4 10*3/uL — ABNORMAL HIGH (ref 0.7–3.1)
Lymphs: 41 %
MCH: 30.4 pg (ref 26.6–33.0)
MCHC: 33.5 g/dL (ref 31.5–35.7)
MCV: 91 fL (ref 79–97)
Monocytes Absolute: 0.9 10*3/uL (ref 0.1–0.9)
Monocytes: 8 %
Neutrophils Absolute: 4.9 10*3/uL (ref 1.4–7.0)
Neutrophils: 46 %
Platelets: 314 10*3/uL (ref 150–450)
RBC: 5.13 x10E6/uL (ref 4.14–5.80)
RDW: 13 % (ref 11.6–15.4)
WBC: 10.8 10*3/uL (ref 3.4–10.8)

## 2023-12-05 LAB — COMPREHENSIVE METABOLIC PANEL WITH GFR
ALT: 41 IU/L (ref 0–44)
AST: 22 IU/L (ref 0–40)
Albumin: 4.3 g/dL (ref 3.8–4.9)
Alkaline Phosphatase: 71 IU/L (ref 44–121)
BUN/Creatinine Ratio: 13 (ref 9–20)
BUN: 12 mg/dL (ref 6–24)
Bilirubin Total: 0.2 mg/dL (ref 0.0–1.2)
CO2: 21 mmol/L (ref 20–29)
Calcium: 9.8 mg/dL (ref 8.7–10.2)
Chloride: 103 mmol/L (ref 96–106)
Creatinine, Ser: 0.89 mg/dL (ref 0.76–1.27)
Globulin, Total: 2.6 g/dL (ref 1.5–4.5)
Glucose: 93 mg/dL (ref 70–99)
Potassium: 4.2 mmol/L (ref 3.5–5.2)
Sodium: 139 mmol/L (ref 134–144)
Total Protein: 6.9 g/dL (ref 6.0–8.5)
eGFR: 104 mL/min/{1.73_m2} (ref >59)

## 2023-12-07 ENCOUNTER — Ambulatory Visit: Payer: Self-pay

## 2023-12-07 ENCOUNTER — Encounter: Payer: Self-pay | Admitting: Family Medicine

## 2023-12-08 ENCOUNTER — Other Ambulatory Visit: Payer: Self-pay | Admitting: Family Medicine

## 2023-12-08 NOTE — Telephone Encounter (Signed)
 Called patient left voicemail to contact office. Can patient see another provider due to swelling from medication?

## 2023-12-08 NOTE — Telephone Encounter (Signed)
 Office visit

## 2023-12-09 NOTE — Telephone Encounter (Signed)
 Called patient back, patient stopped taking the medication and the swelling has gone away. He will call if needs an appointment.

## 2024-02-02 ENCOUNTER — Telehealth: Payer: Self-pay

## 2024-02-02 NOTE — Telephone Encounter (Signed)
 Copied from CRM [id number]. Topic: Appointments - Scheduling Inquiry for Clinic >> Feb 02, 2024  1:22 PM Ivette P wrote: Reason for CRM: PT called in to schedule diabetic eye exam and would only allow to schedule month of August. Pt stated he needs a Tuesday app. Or anything after 09/15.  Please reach out to pt to schedule.    Pt callback [phone]

## 2024-02-08 ENCOUNTER — Ambulatory Visit
Admission: RE | Admit: 2024-02-08 | Discharge: 2024-02-08 | Disposition: A | Discharge status: Home / Self Care | Source: Ambulatory Visit | Attending: Family Medicine | Admitting: Family Medicine

## 2024-02-08 VITALS — BP 150/89 | HR 62 | Temp 98.2°F | Resp 18

## 2024-02-08 DIAGNOSIS — H1032 Unspecified acute conjunctivitis, left eye: Secondary | ICD-10-CM | POA: Diagnosis not present

## 2024-02-08 DIAGNOSIS — S0502XA Injury of conjunctiva and corneal abrasion without foreign body, left eye, initial encounter: Secondary | ICD-10-CM | POA: Diagnosis not present

## 2024-02-08 MED ORDER — ERYTHROMYCIN 5 MG/GM OP OINT: TOPICAL_OINTMENT | OPHTHALMIC | 0 refills | Status: DC

## 2024-02-08 NOTE — ED Triage Notes (Signed)
 Pt being seen in UC for eye pain/problem. Pt reports weed eating on Wednesday, felt something hit eye, Thursday pt noticed sensitivity to light, pt reports pain 6/10. Pt reports flushing eye with no relief.

## 2024-02-08 NOTE — ED Provider Notes (Signed)
 [facility]    CSN: [id number] Arrival date & time: 02/08/24  1341      History   Chief Complaint Chief Complaint  Patient presents with   Eye Problem    Localized pain - Entered by patient   Eye Pain    HPI Aaron Poole is a 53 y.o. male.   Patient presenting today with 5 to 6-day history of left eye pain, redness, clear watery drainage.  States symptoms started while weed eating, thinks he got something into his eye.  He did some eye flushes and states he no longer after the felt like there was anything in the eye but it having diffuse eye irritation, pain, light sensitivity, drainage.  Denies visual change, headache, nausea, vomiting, headache, dizziness.  Has been using lubricating drops with no relief.    Past Medical History:  Diagnosis Date   Anal fissure    Arthritis    GERD (gastroesophageal reflux disease)    High cholesterol    Hypertension    IBS (irritable bowel syndrome)    Neuromuscular disorder (HCC)    Pinched nerve    Sleep apnea     Patient Active Problem List   Diagnosis Date Noted   Tendonitis of ankle 04/23/2023   Type 2 diabetes mellitus (HCC) 04/23/2023   Tobacco dependence 01/05/2023   Hypertension 01/05/2023   Dysphagia, pharyngoesophageal phase 02/27/2015   Family history of colon cancer 02/27/2015   Rectal bleeding 02/27/2015    Past Surgical History:  Procedure Laterality Date   COLONOSCOPY     2016   ELBOW SURGERY Right    KNEE SURGERY Bilateral    SHOULDER SURGERY Right    TONSILLECTOMY     age 61   URETHRAL CYST REMOVAL         Home Medications    Prior to Admission medications   Medication Sig Start Date End Date Taking? Authorizing Provider  erythromycin  ophthalmic ointment Place a 1/2 inch ribbon of ointment into the left lower eyelid BID. 02/08/24  Yes Stuart Vernell Norris, PA-C  acetaminophen  (TYLENOL ) 500 MG tablet Take 500 mg by mouth every 6 (six) hours as needed for mild pain.    [provider]  amLODipine (NORVASC) 10 MG tablet Take 10 mg by mouth daily.    [provider]  gabapentin  (NEURONTIN ) 300 MG capsule Take 1 capsule (300 mg total) by mouth 2 (two) times daily as needed. Patient not taking: Reported on 02/08/2024 08/20/23   Del Orbe Polanco, Iliana, FNP  glimepiride  (AMARYL ) 2 MG tablet TAKE 1 TABLET BY MOUTH DAILY BEFORE BREAKFAST. 07/13/23   Del Orbe Polanco, Iliana, FNP  ibuprofen (ADVIL) 200 MG tablet Take 200 mg by mouth as needed.    [provider]  olmesartan  (BENICAR ) 20 MG tablet TAKE 1/2 TABLET BY MOUTH DAILY 09/07/23   Del Orbe Polanco, Iliana, FNP  rosuvastatin  (CRESTOR ) 20 MG tablet TAKE 1 TABLET BY MOUTH EVERY DAY 12/08/23   Del Wilhelmena Falter, Hilario, FNP  Semaglutide , 1 MG/DOSE, 4 MG/3ML SOPN Inject 1 mg as directed once a week. Patient not taking: Reported on 02/08/2024 09/30/23   Del Wilhelmena Falter Hilario, FNP    Family History Family History  Problem Relation Age of Onset   Colon polyps Mother    Diabetes Mother    COPD Mother    Irritable bowel syndrome Mother    Irritable bowel syndrome Sister    Diabetes Maternal Aunt    Diabetes Maternal Grandfather  Colon cancer Other    Crohn's disease Neg Hx    Esophageal cancer Neg Hx    Rectal cancer Neg Hx    Stomach cancer Neg Hx    Ulcerative colitis Neg Hx     Social History Social History   Tobacco Use   Smoking status: Every Day    Current packs/day: 1.50    Average packs/day: 1.5 packs/day for 33.7 years (50.5 ttl pk-yrs)    Types: Cigarettes    Start date: 1992    Passive exposure: Current   Smokeless tobacco: Never  Vaping Use   Vaping status: Never Used  Substance Use Topics   Alcohol use: Yes    Alcohol/week: 0.0 standard drinks of alcohol    Comment: occasionally   Drug use: No     Allergies   Aspirin, Wasp venom protein, and Ezetimibe   Review of Systems Review of Systems Per HPI  Physical Exam Triage Vital Signs ED Triage Vitals [02/08/24  1422]  Encounter Vitals Group     BP (!) 150/89     Girls Systolic BP Percentile      Girls Diastolic BP Percentile      Boys Systolic BP Percentile      Boys Diastolic BP Percentile      Pulse Rate 62     Resp 18     Temp 98.2 F (36.8 C)     Temp Source Oral     SpO2 95 %     Weight      Height      Head Circumference      Peak Flow      Pain Score 6     Pain Loc      Pain Education      Exclude from Growth Chart    No data found.  Updated Vital Signs BP (!) 150/89 (BP Location: Right Arm)   Pulse 62   Temp 98.2 F (36.8 C) (Oral)   Resp 18   SpO2 95%   Visual Acuity Right Eye Distance:   Left Eye Distance:   Bilateral Distance:    Right Eye Near:   Left Eye Near:    Bilateral Near:     Physical Exam Vitals and nursing note reviewed.  Constitutional:      Appearance: Normal appearance.  HENT:     Head: Atraumatic.  Eyes:     Extraocular Movements: Extraocular movements intact.     Pupils: Pupils are equal, round, and reactive to light.     Comments: Left conjunctiva diffusely injected, erythematous, clear drainage.  No foreign body appreciable on exam  Cardiovascular:     Rate and Rhythm: Normal rate.  Pulmonary:     Effort: Pulmonary effort is normal.  Musculoskeletal:        General: Normal range of motion.     Cervical back: Normal range of motion and neck supple.  Skin:    General: Skin is warm and dry.  Neurological:     General: No focal deficit present.     Mental Status: He is oriented to person, place, and time.  Psychiatric:        Mood and Affect: Mood normal.        Thought Content: Thought content normal.        Judgment: Judgment normal.      UC Treatments / Results  Labs (all labs ordered are listed, but only abnormal results are displayed) Labs Reviewed - No data to display  EKG  Radiology No results found.  Procedures Procedures (including critical care time)  Medications Ordered in UC Medications - No data to  display  Initial Impression / Assessment and Plan / UC Course  I have reviewed the triage vital signs and the nursing notes.  Pertinent labs & imaging results that were available during my care of the patient were reviewed by me and considered in my medical decision making (see chart for details).     Visual acuity declined as vision intact per patient.  Suspect corneal abrasion leading now to conjunctivitis.  Will treat with erythromycin  ointment, cool compresses, good handwashing, supportive over-the-counter medications and home care.  Ophthalmology follow-up recommended if not improving.  Final Clinical Impressions(s) / UC Diagnoses   Final diagnoses:  Abrasion of left cornea, initial encounter  Acute conjunctivitis of left eye, unspecified acute conjunctivitis type     Discharge Instructions      Use the erythromycin  eye ointment twice daily until fully resolved.  You may also do cool compresses, good handwashing, lubricating drops or ointments between doses.  Follow-up with the eye specialist if worsening or not resolving     ED Prescriptions     Medication Sig Dispense Auth. Provider   erythromycin  ophthalmic ointment Place a 1/2 inch ribbon of ointment into the left lower eyelid BID. 3.5 g Stuart Vernell Norris, PA-C      PDMP not reviewed this encounter.   Stuart Vernell Norris, [place] 02/08/24 1939

## 2024-02-08 NOTE — Discharge Instructions (Signed)
 Use the erythromycin  eye ointment twice daily until fully resolved.  You may also do cool compresses, good handwashing, lubricating drops or ointments between doses.  Follow-up with the eye specialist if worsening or not resolving

## 2024-02-12 ENCOUNTER — Ambulatory Visit: Admitting: Family Medicine

## 2024-02-12 ENCOUNTER — Encounter: Payer: Self-pay | Admitting: Family Medicine

## 2024-02-12 VITALS — BP 138/86 | HR 61 | Resp 16 | Ht 72.0 in | Wt 316.0 lb

## 2024-02-12 DIAGNOSIS — E1169 Type 2 diabetes mellitus with other specified complication: Secondary | ICD-10-CM

## 2024-02-12 DIAGNOSIS — H44002 Unspecified purulent endophthalmitis, left eye: Secondary | ICD-10-CM | POA: Diagnosis not present

## 2024-02-12 DIAGNOSIS — E038 Other specified hypothyroidism: Secondary | ICD-10-CM | POA: Diagnosis not present

## 2024-02-12 DIAGNOSIS — I10 Essential (primary) hypertension: Secondary | ICD-10-CM | POA: Diagnosis not present

## 2024-02-12 MED ORDER — AMOXICILLIN-POT CLAVULANATE 875-125 MG PO TABS: 1.0000 | ORAL_TABLET | Freq: Two times a day (BID) | ORAL | 0 refills | Status: AC

## 2024-02-12 NOTE — Patient Instructions (Signed)

## 2024-02-12 NOTE — Assessment & Plan Note (Addendum)
 Start Augmentin  875-125 mg twice daily x 7 days Discussed handwashing to limit spread to others, decrease rubbing of eye, careful flushing of eyes with saline.

## 2024-02-12 NOTE — Progress Notes (Signed)
 Established Patient Office Visit   Subjective  Patient ID: Aaron Poole, male    DOB: 17-Sep-1970  Age: 53 y.o. MRN: [id number]  Chief Complaint  Patient presents with   Eye Problem    Left eye red and puffy. Was weedeating last wed and something went in his eye, was able to flush it out. Since then, every time his eye dilates it shoots pain to his cheekbone. Used erythromycin  ointment for 2 days and the pain got worse so he stopped it Monday. Switched to OTC Blink drops. Pain is still there with sensitivity    He  has a past medical history of Anal fissure, Arthritis, GERD (gastroesophageal reflux disease), High cholesterol, Hypertension, IBS (irritable bowel syndrome), Neuromuscular disorder (HCC), Pinched nerve, and Sleep apnea.  Patient presents with severe left eye pain, rated 9/10, which began within the past 7 days and has been gradually worsening. The problem is constant, with no known injury mechanism and no known exposure to conjunctivitis. The patient does not wear contact lenses. Associated symptoms include blurred vision, eye discharge, redness, and itching, without fever. He has tried prescribed erythromycin  eye ointment  commercial eye wash and eye drops with no relief.    Review of Systems  Constitutional:  Negative for fever.  Eyes:  Positive for blurred vision, pain, discharge and redness.  Respiratory:  Negative for shortness of breath.   Cardiovascular:  Negative for chest pain.  Gastrointestinal:  Negative for abdominal pain.  Neurological:  Negative for dizziness and headaches.      Objective:     BP 138/86   Pulse 61   Resp 16   Ht 6' (1.829 m)   Wt (!) 316 lb (143.3 kg)   SpO2 94%   BMI 42.86 kg/m  BP Readings from Last 3 Encounters:  02/12/24 138/86  02/08/24 (!) 150/89  12/04/23 136/84      Physical Exam Vitals reviewed.  Constitutional:      General: He is not in acute distress.    Appearance: Normal appearance. He is not ill-appearing,  toxic-appearing or diaphoretic.  HENT:     Head: Normocephalic.  Eyes:     General:        Right eye: No discharge.        Left eye: No discharge.     Extraocular Movements: Extraocular movements intact.     Pupils: Pupils are equal, round, and reactive to light.     Comments: Left eye redness   Cardiovascular:     Rate and Rhythm: Normal rate.     Pulses: Normal pulses.     Heart sounds: Normal heart sounds.  Pulmonary:     Effort: Pulmonary effort is normal. No respiratory distress.     Breath sounds: Normal breath sounds.  Skin:    General: Skin is warm and dry.  Neurological:     Mental Status: He is alert.     Coordination: Coordination normal.     Gait: Gait normal.  Psychiatric:        Mood and Affect: Mood normal.      No results found for any visits on 02/12/24.  The ASCVD Risk score (Arnett DK, et al., 2019) failed to calculate for the following reasons:   The valid total cholesterol range is 130 to 320 mg/dL    Assessment & Plan:  Infection of left eye  Type 2 diabetes mellitus with other specified complication, without long-term current use of insulin (HCC) Assessment & Plan: Last Hemoglobin  A1c: 6.2 Labs: Ordered today, results pending; will follow up accordingly. The patient reports adhering to prescribed medications:  Glimepiride  2 mg once daily   Reviewed non-pharmacological interventions, including a balanced diet rich in lean proteins, healthy fats, whole grains, and high-fiber vegetables. Emphasized reducing refined sugars and processed carbohydrates, and incorporating more fruits, leafy greens, and legumes. Education: Patient was educated on recognizing signs and symptoms of both hypoglycemia and hyperglycemia, and advised to seek emergency care if these symptoms occur. Follow-Up: Scheduled for follow-up in 3-4 months, or sooner if needed. Patient Understanding: The patient verbalized understanding of the care plan, and all questions were  answered. Additional Care: Ophthalmology referral was placed. Foot exam results were within normal limits.   Orders: -     Hemoglobin A1c -     Microalbumin / creatinine urine ratio -     Ambulatory referral to Ophthalmology  Primary hypertension -     Lipid panel -     BMP8+eGFR -     CBC with Differential/Platelet  TSH (thyroid-stimulating hormone deficiency) -     TSH + free T4  Eye infection, left Assessment & Plan: Start Augmentin  875-125 mg twice daily x 7 days Discussed handwashing to limit spread to others, decrease rubbing of eye, careful flushing of eyes with saline.      Other orders -     Amoxicillin -Pot Clavulanate; Take 1 tablet by mouth 2 (two) times daily for 7 days.  Dispense: 14 tablet; Refill: 0    Return in about 3 months (around 05/13/2024), or if symptoms worsen or fail to improve, for chronic follow-up.   Hilario Kidd Wilhelmena Falter, FNP

## 2024-02-12 NOTE — Assessment & Plan Note (Signed)
 Last Hemoglobin A1c: 6.2 Labs: Ordered today, results pending; will follow up accordingly. The patient reports adhering to prescribed medications:  Glimepiride  2 mg once daily   Reviewed non-pharmacological interventions, including a balanced diet rich in lean proteins, healthy fats, whole grains, and high-fiber vegetables. Emphasized reducing refined sugars and processed carbohydrates, and incorporating more fruits, leafy greens, and legumes. Education: Patient was educated on recognizing signs and symptoms of both hypoglycemia and hyperglycemia, and advised to seek emergency care if these symptoms occur. Follow-Up: Scheduled for follow-up in 3-4 months, or sooner if needed. Patient Understanding: The patient verbalized understanding of the care plan, and all questions were answered. Additional Care: Ophthalmology referral was placed. Foot exam results were within normal limits.

## 2024-02-13 LAB — BMP8+EGFR
BUN/Creatinine Ratio: 12 (ref 9–20)
BUN: 10 mg/dL (ref 6–24)
CO2: 23 mmol/L (ref 20–29)
Calcium: 9.5 mg/dL (ref 8.7–10.2)
Chloride: 104 mmol/L (ref 96–106)
Creatinine, Ser: 0.81 mg/dL (ref 0.76–1.27)
Glucose: 102 mg/dL — ABNORMAL HIGH (ref 70–99)
Potassium: 4.7 mmol/L (ref 3.5–5.2)
Sodium: 139 mmol/L (ref 134–144)
eGFR: 107 mL/min/1.73 (ref >59)

## 2024-02-13 LAB — CBC WITH DIFFERENTIAL/PLATELET
Basophils Absolute: 0.1 x10E3/uL (ref 0.0–0.2)
Basos: 2 %
EOS (ABSOLUTE): 0.4 x10E3/uL (ref 0.0–0.4)
Eos: 4 %
Hematocrit: 45.1 % (ref 37.5–51.0)
Hemoglobin: 15.1 g/dL (ref 13.0–17.7)
Immature Grans (Abs): 0 x10E3/uL (ref 0.0–0.1)
Immature Granulocytes: 0 %
Lymphocytes Absolute: 3.3 x10E3/uL — ABNORMAL HIGH (ref 0.7–3.1)
Lymphs: 38 %
MCH: 30.8 pg (ref 26.6–33.0)
MCHC: 33.5 g/dL (ref 31.5–35.7)
MCV: 92 fL (ref 79–97)
Monocytes Absolute: 0.8 x10E3/uL (ref 0.1–0.9)
Monocytes: 9 %
Neutrophils Absolute: 4.1 x10E3/uL (ref 1.4–7.0)
Neutrophils: 47 %
Platelets: 296 x10E3/uL (ref 150–450)
RBC: 4.9 x10E6/uL (ref 4.14–5.80)
RDW: 13.1 % (ref 11.6–15.4)
WBC: 8.7 x10E3/uL (ref 3.4–10.8)

## 2024-02-13 LAB — TSH+FREE T4
Free T4: 0.8 ng/dL — ABNORMAL LOW (ref 0.82–1.77)
TSH: 2.1 u[IU]/mL (ref 0.450–4.500)

## 2024-02-13 LAB — LIPID PANEL
Chol/HDL Ratio: 3.4 ratio (ref 0.0–5.0)
Cholesterol, Total: 121 mg/dL (ref 100–199)
HDL: 36 mg/dL — ABNORMAL LOW (ref >39)
LDL Chol Calc (NIH): 61 mg/dL (ref 0–99)
Triglycerides: 135 mg/dL (ref 0–149)
VLDL Cholesterol Cal: 24 mg/dL (ref 5–40)

## 2024-02-13 LAB — HEMOGLOBIN A1C
Est. average glucose Bld gHb Est-mCnc: 134 mg/dL
Hgb A1c MFr Bld: 6.3 % — ABNORMAL HIGH (ref 4.8–5.6)

## 2024-02-17 ENCOUNTER — Ambulatory Visit: Admitting: Family Medicine

## 2024-02-17 ENCOUNTER — Ambulatory Visit: Payer: Self-pay | Admitting: Family Medicine

## 2024-02-19 ENCOUNTER — Telehealth: Payer: Self-pay

## 2024-02-19 ENCOUNTER — Other Ambulatory Visit: Payer: Self-pay | Admitting: Family Medicine

## 2024-02-19 MED ORDER — CLINDAMYCIN HCL 300 MG PO CAPS: 300.0000 mg | ORAL_CAPSULE | Freq: Three times a day (TID) | ORAL | 0 refills | Status: AC

## 2024-02-19 NOTE — Telephone Encounter (Signed)
 You were sent a prescription for clindamycin . Please make sure to finish the entire course as prescribed, even if your symptoms improve before it's done. This helps fully clear the infection and prevent it from coming back.

## 2024-02-19 NOTE — Telephone Encounter (Signed)
 Copied from CRM [id number]. Topic: Clinical - Medical Advice >> Feb 19, 2024  8:49 AM Gustabo D wrote: amoxicillin -clavulanate (AUGMENTIN ) 875-125 MG tablet- Pt eye is isn't 100% it's getting better but he's almost out and wants to know can he get more or is there something else he can take. It's being very resistant.

## 2024-02-19 NOTE — Telephone Encounter (Signed)
 Message sent to patient

## 2024-02-24 ENCOUNTER — Ambulatory Visit: Admitting: Family Medicine

## 2024-03-30 ENCOUNTER — Other Ambulatory Visit: Payer: Self-pay | Admitting: Family Medicine

## 2024-04-27 ENCOUNTER — Other Ambulatory Visit: Payer: Self-pay | Admitting: Family Medicine

## 2024-05-28 ENCOUNTER — Other Ambulatory Visit: Payer: Self-pay | Admitting: Family Medicine

## 2024-06-20 ENCOUNTER — Ambulatory Visit

## 2024-06-20 VITALS — BP 158/79 | HR 60 | Resp 16 | Ht 72.0 in | Wt 329.0 lb

## 2024-06-20 DIAGNOSIS — Z6841 Body Mass Index (BMI) 40.0 and over, adult: Secondary | ICD-10-CM | POA: Diagnosis not present

## 2024-06-20 DIAGNOSIS — F419 Anxiety disorder, unspecified: Secondary | ICD-10-CM | POA: Insufficient documentation

## 2024-06-20 DIAGNOSIS — E119 Type 2 diabetes mellitus without complications: Secondary | ICD-10-CM | POA: Diagnosis not present

## 2024-06-20 DIAGNOSIS — E559 Vitamin D deficiency, unspecified: Secondary | ICD-10-CM | POA: Insufficient documentation

## 2024-06-20 DIAGNOSIS — I10 Essential (primary) hypertension: Secondary | ICD-10-CM | POA: Diagnosis not present

## 2024-06-20 MED ORDER — BUSPIRONE HCL 7.5 MG PO TABS: 7.5000 mg | ORAL_TABLET | Freq: Two times a day (BID) | ORAL | 2 refills | Status: DC | PRN

## 2024-06-20 NOTE — Progress Notes (Signed)
 "  Established Patient Office Visit  Subjective   Patient ID: Aaron Poole, male    DOB: [birth year]  Age: 54 y.o. MRN: [id number]  Chief Complaint  Patient presents with   Medical Management of Chronic Issues    Follow up     HPI  Patient Active Problem List   Diagnosis Date Noted   Vitamin D  deficiency 06/20/2024   Anxiety 06/20/2024   BMI 40.0-44.9, adult (HCC) 06/20/2024   Eye infection, left 02/12/2024   Tendonitis of ankle 04/23/2023   Type 2 diabetes mellitus (HCC) 04/23/2023   Tobacco dependence 01/05/2023   Hypertension 01/05/2023   Dysphagia, pharyngoesophageal phase 02/27/2015   Family history of colon cancer 02/27/2015   Rectal bleeding 02/27/2015    ROS    Objective:     BP (!) 158/79   Pulse 60   Resp 16   Ht 6' (1.829 m)   Wt (!) 329 lb (149.2 kg)   SpO2 95%   BMI 44.62 kg/m  BP Readings from Last 3 Encounters:  06/20/24 (!) 158/79  02/12/24 138/86  02/08/24 (!) 150/89   Wt Readings from Last 3 Encounters:  06/20/24 (!) 329 lb (149.2 kg)  02/12/24 (!) 316 lb (143.3 kg)  08/20/23 (!) 321 lb 0.6 oz (145.6 kg)     Physical Exam Vitals and nursing note reviewed.  Constitutional:      Appearance: Normal appearance. He is obese.  HENT:     Head: Normocephalic.  Eyes:     Extraocular Movements: Extraocular movements intact.     Pupils: Pupils are equal, round, and reactive to light.  Cardiovascular:     Rate and Rhythm: Normal rate and regular rhythm.  Pulmonary:     Effort: Pulmonary effort is normal.     Breath sounds: Normal breath sounds.  Musculoskeletal:     Cervical back: Normal range of motion and neck supple.  Neurological:     Mental Status: He is alert and oriented to person, place, and time.  Psychiatric:        Mood and Affect: Mood normal.        Thought Content: Thought content normal.     Diabetic foot exam was performed with the following findings:   No deformities, ulcerations, or other skin breakdown Normal  sensation of 10g monofilament Intact posterior tibialis and dorsalis pedis pulses     No results found for any visits on 06/20/24.    The ASCVD Risk score (Arnett DK, et al., 2019) failed to calculate for the following reasons:   The valid total cholesterol range is 130 to 320 mg/dL    Assessment & Plan:   Problem List Items Addressed This Visit       Cardiovascular and Mediastinum   Hypertension - Primary   Elevated BP 150/90 mmHg, likely due to stopping antihypertensives. - Resume antihypertensive medication.      Relevant Orders   CMP14+EGFR     Endocrine   Type 2 diabetes mellitus (HCC)   Well-controlled with A1c 6.2-6.3%. Possible neuropathy or carpal tunnel syndrome causing fingertip numbness.      Relevant Orders   CMP14+EGFR   Hemoglobin A1c   Microalbumin / creatinine urine ratio   Lipid panel   [facility] Diabetes Foot Exam (Completed)     Other   Vitamin D  deficiency   Recheck vitamin D  levels.       Relevant Orders   Vitamin D  (25 hydroxy)   Anxiety   New onset anxiety post-smoking cessation  with nocturnal symptoms. - Prescribed Buspar  twice daily as needed.      Relevant Medications   busPIRone  (BUSPAR ) 7.5 MG tablet   BMI 40.0-44.9, adult (HCC)   Discussed lifestyle changes for weight loss.  - Encouraged finding affordable gym for regular exercise. - Updated blood work for health monitoring.       No follow-ups on file.    Leita Longs, FNP  "

## 2024-06-20 NOTE — Assessment & Plan Note (Signed)
 Recheck vitamin D  levels

## 2024-06-20 NOTE — Assessment & Plan Note (Signed)
 New onset anxiety post-smoking cessation with nocturnal symptoms. - Prescribed Buspar  twice daily as needed.

## 2024-06-20 NOTE — Assessment & Plan Note (Signed)
 Discussed lifestyle changes for weight loss.  - Encouraged finding affordable gym for regular exercise. - Updated blood work for health monitoring.

## 2024-06-20 NOTE — Assessment & Plan Note (Signed)
 Elevated BP 150/90 mmHg, likely due to stopping antihypertensives. - Resume antihypertensive medication.

## 2024-06-20 NOTE — Assessment & Plan Note (Signed)
 Well-controlled with A1c 6.2-6.3%. Possible neuropathy or carpal tunnel syndrome causing fingertip numbness.

## 2024-06-22 LAB — LIPID PANEL
Chol/HDL Ratio: 5.1 ratio — ABNORMAL HIGH (ref 0.0–5.0)
Cholesterol, Total: 213 mg/dL — ABNORMAL HIGH (ref 100–199)
HDL: 42 mg/dL
LDL Chol Calc (NIH): 152 mg/dL — ABNORMAL HIGH (ref 0–99)
Triglycerides: 105 mg/dL (ref 0–149)
VLDL Cholesterol Cal: 19 mg/dL (ref 5–40)

## 2024-06-22 LAB — CMP14+EGFR
ALT: 44 IU/L (ref 0–44)
AST: 26 IU/L (ref 0–40)
Albumin: 4.1 g/dL (ref 3.8–4.9)
Alkaline Phosphatase: 66 IU/L (ref 47–123)
BUN/Creatinine Ratio: 13 (ref 9–20)
BUN: 12 mg/dL (ref 6–24)
Bilirubin Total: 0.4 mg/dL (ref 0.0–1.2)
CO2: 24 mmol/L (ref 20–29)
Calcium: 9.6 mg/dL (ref 8.7–10.2)
Chloride: 105 mmol/L (ref 96–106)
Creatinine, Ser: 0.91 mg/dL (ref 0.76–1.27)
Globulin, Total: 2.4 g/dL (ref 1.5–4.5)
Glucose: 116 mg/dL — ABNORMAL HIGH (ref 70–99)
Potassium: 4.8 mmol/L (ref 3.5–5.2)
Sodium: 142 mmol/L (ref 134–144)
Total Protein: 6.5 g/dL (ref 6.0–8.5)
eGFR: 101 mL/min/1.73

## 2024-06-22 LAB — VITAMIN D 25 HYDROXY (VIT D DEFICIENCY, FRACTURES): Vit D, 25-Hydroxy: 16.8 ng/mL — ABNORMAL LOW (ref 30.0–100.0)

## 2024-06-22 LAB — MICROALBUMIN / CREATININE URINE RATIO
Creatinine, Urine: 220.1 mg/dL
Microalb/Creat Ratio: 6 mg/g{creat} (ref 0–29)
Microalbumin, Urine: 13.4 ug/mL

## 2024-06-22 LAB — HEMOGLOBIN A1C
Est. average glucose Bld gHb Est-mCnc: 134 mg/dL
Hgb A1c MFr Bld: 6.3 % — ABNORMAL HIGH (ref 4.8–5.6)

## 2024-07-10 ENCOUNTER — Other Ambulatory Visit: Payer: Self-pay | Admitting: Family Medicine

## 2024-07-12 ENCOUNTER — Other Ambulatory Visit: Payer: Self-pay

## 2024-07-12 DIAGNOSIS — F419 Anxiety disorder, unspecified: Secondary | ICD-10-CM

## 2024-07-12 MED ORDER — BUSPIRONE HCL 7.5 MG PO TABS: 7.5000 mg | ORAL_TABLET | Freq: Two times a day (BID) | ORAL | 2 refills | Status: AC | PRN

## 2024-12-26 ENCOUNTER — Ambulatory Visit: Payer: Self-pay
